# Patient Record
Sex: Female | Born: 1965 | Race: White | Hispanic: No | Marital: Married | Smoking: Former smoker
Health system: Southern US, Community
[De-identification: ages and names within clinical notes are randomized; demographics above are authoritative.]

## PROBLEM LIST (undated history)

## (undated) DIAGNOSIS — N200 Calculus of kidney: Secondary | ICD-10-CM

## (undated) DIAGNOSIS — B009 Herpesviral infection, unspecified: Secondary | ICD-10-CM

## (undated) DIAGNOSIS — R0602 Shortness of breath: Secondary | ICD-10-CM

## (undated) DIAGNOSIS — I10 Essential (primary) hypertension: Secondary | ICD-10-CM

## (undated) DIAGNOSIS — E785 Hyperlipidemia, unspecified: Secondary | ICD-10-CM

## (undated) DIAGNOSIS — R42 Dizziness and giddiness: Secondary | ICD-10-CM

## (undated) HISTORY — DX: Hyperlipidemia, unspecified: E78.5

## (undated) HISTORY — DX: Shortness of breath: R06.02

## (undated) HISTORY — DX: Herpesviral infection, unspecified: B00.9

## (undated) HISTORY — PX: TONSILLECTOMY: SUR1361

## (undated) HISTORY — DX: Dizziness and giddiness: R42

## (undated) HISTORY — DX: Essential (primary) hypertension: I10

---

## 2001-04-04 ENCOUNTER — Ambulatory Visit (HOSPITAL_BASED_OUTPATIENT_CLINIC_OR_DEPARTMENT_OTHER): Admission: RE | Admit: 2001-04-04 | Discharge: 2001-04-04 | Payer: Self-pay | Admitting: *Deleted

## 2004-12-13 ENCOUNTER — Other Ambulatory Visit: Admission: RE | Admit: 2004-12-13 | Discharge: 2004-12-13 | Payer: Self-pay | Admitting: Family Medicine

## 2005-09-08 ENCOUNTER — Emergency Department (HOSPITAL_COMMUNITY): Admission: EM | Admit: 2005-09-08 | Discharge: 2005-09-08 | Payer: Self-pay | Admitting: Family Medicine

## 2005-10-23 ENCOUNTER — Ambulatory Visit (HOSPITAL_COMMUNITY): Admission: RE | Admit: 2005-10-23 | Discharge: 2005-10-23 | Payer: Self-pay | Admitting: Family Medicine

## 2006-03-07 ENCOUNTER — Other Ambulatory Visit: Admission: RE | Admit: 2006-03-07 | Discharge: 2006-03-07 | Payer: Self-pay | Admitting: Family Medicine

## 2006-11-11 ENCOUNTER — Ambulatory Visit (HOSPITAL_COMMUNITY): Admission: RE | Admit: 2006-11-11 | Discharge: 2006-11-11 | Payer: Self-pay | Admitting: Family Medicine

## 2007-05-14 ENCOUNTER — Other Ambulatory Visit: Admission: RE | Admit: 2007-05-14 | Discharge: 2007-05-14 | Payer: Self-pay | Admitting: Family Medicine

## 2007-09-16 ENCOUNTER — Ambulatory Visit (HOSPITAL_COMMUNITY): Admission: RE | Admit: 2007-09-16 | Discharge: 2007-09-16 | Payer: Self-pay | Admitting: Gynecology

## 2008-03-15 ENCOUNTER — Ambulatory Visit (HOSPITAL_COMMUNITY): Admission: RE | Admit: 2008-03-15 | Discharge: 2008-03-15 | Payer: Self-pay | Admitting: Family Medicine

## 2009-03-21 ENCOUNTER — Ambulatory Visit (HOSPITAL_COMMUNITY): Admission: RE | Admit: 2009-03-21 | Discharge: 2009-03-21 | Payer: Self-pay | Admitting: Family Medicine

## 2009-09-16 ENCOUNTER — Other Ambulatory Visit: Admission: RE | Admit: 2009-09-16 | Discharge: 2009-09-16 | Payer: Self-pay | Admitting: Family Medicine

## 2010-04-14 ENCOUNTER — Ambulatory Visit (HOSPITAL_COMMUNITY): Admission: RE | Admit: 2010-04-14 | Discharge: 2010-04-14 | Payer: Self-pay | Admitting: Family Medicine

## 2010-04-19 ENCOUNTER — Encounter: Admission: RE | Admit: 2010-04-19 | Discharge: 2010-04-19 | Payer: Self-pay | Admitting: Family Medicine

## 2010-10-11 ENCOUNTER — Other Ambulatory Visit: Admission: RE | Admit: 2010-10-11 | Discharge: 2010-10-11 | Payer: Self-pay | Admitting: Family Medicine

## 2010-12-16 ENCOUNTER — Emergency Department (HOSPITAL_COMMUNITY)
Admission: EM | Admit: 2010-12-16 | Discharge: 2010-12-16 | Payer: Self-pay | Source: Home / Self Care | Admitting: Emergency Medicine

## 2011-01-21 ENCOUNTER — Encounter: Payer: Self-pay | Admitting: Family Medicine

## 2011-05-18 NOTE — Op Note (Signed)
[place]. [facility]  Patient:    [name], [name]                      MRN: [id number] Proc. Date: 04/04/01 Attending:  Vikki Ports, M.D.                           Operative Report  PREOPERATIVE DIAGNOSIS:  Dysplastic nevus of the right foot.  POSTOPERATIVE DIAGNOSIS:  Dysplastic nevus of the right foot.  PROCEDURE:  Excision of dysplastic nevus of the right foot.  ANESTHESIA:  Local MAC.  DESCRIPTION OF PROCEDURE:  Patient was taken to the operating room and placed in a supine position.  After adequate anesthesia was induced using MAC technique, the right foot was prepped and draped in normal sterile fashion. Using 1% lidocaine local anesthesia, the skin and subcutaneous tissue underlying the nevus on the right lateral foot were anesthetized.  A 2 cm long elliptical incision was made around the previous biopsy site, dissected down onto subcutaneous tissue fat.  The tissue was removed en bloc, and margins were marked.  The tissue was under some tension, and therefore it was closed with interrupted 2-0 nylon mattress sutures.  A sterile dressing was applied. The patient tolerated the procedure well and went to PACU in good condition. DD:  04/04/01 TD:  04/04/01 Job: [id number]

## 2011-10-22 ENCOUNTER — Other Ambulatory Visit (HOSPITAL_COMMUNITY): Payer: Self-pay | Admitting: Family Medicine

## 2011-10-22 DIAGNOSIS — Z1231 Encounter for screening mammogram for malignant neoplasm of breast: Secondary | ICD-10-CM

## 2011-11-14 ENCOUNTER — Ambulatory Visit (HOSPITAL_COMMUNITY)
Admission: RE | Admit: 2011-11-14 | Discharge: 2011-11-14 | Disposition: A | Discharge status: Home / Self Care | Payer: 59 | Source: Ambulatory Visit | Attending: Family Medicine | Admitting: Family Medicine

## 2011-11-14 DIAGNOSIS — Z1231 Encounter for screening mammogram for malignant neoplasm of breast: Secondary | ICD-10-CM | POA: Insufficient documentation

## 2012-07-01 ENCOUNTER — Other Ambulatory Visit (HOSPITAL_COMMUNITY)
Admission: RE | Admit: 2012-07-01 | Discharge: 2012-07-01 | Disposition: A | Discharge status: Home / Self Care | Payer: 59 | Source: Ambulatory Visit | Attending: Family Medicine | Admitting: Family Medicine

## 2012-07-01 ENCOUNTER — Other Ambulatory Visit: Payer: Self-pay | Admitting: Family Medicine

## 2012-07-01 DIAGNOSIS — Z124 Encounter for screening for malignant neoplasm of cervix: Secondary | ICD-10-CM | POA: Insufficient documentation

## 2012-07-23 ENCOUNTER — Emergency Department (HOSPITAL_BASED_OUTPATIENT_CLINIC_OR_DEPARTMENT_OTHER)
Admission: EM | Admit: 2012-07-23 | Discharge: 2012-07-23 | Disposition: A | Discharge status: Home / Self Care | Payer: 59 | Attending: Emergency Medicine | Admitting: Emergency Medicine

## 2012-07-23 ENCOUNTER — Emergency Department (HOSPITAL_BASED_OUTPATIENT_CLINIC_OR_DEPARTMENT_OTHER): Payer: 59

## 2012-07-23 ENCOUNTER — Encounter (HOSPITAL_BASED_OUTPATIENT_CLINIC_OR_DEPARTMENT_OTHER): Payer: Self-pay | Admitting: *Deleted

## 2012-07-23 DIAGNOSIS — N201 Calculus of ureter: Secondary | ICD-10-CM | POA: Insufficient documentation

## 2012-07-23 DIAGNOSIS — N132 Hydronephrosis with renal and ureteral calculous obstruction: Secondary | ICD-10-CM

## 2012-07-23 HISTORY — DX: Essential (primary) hypertension: I10

## 2012-07-23 HISTORY — DX: Calculus of kidney: N20.0

## 2012-07-23 LAB — URINE MICROSCOPIC-ADD ON

## 2012-07-23 LAB — URINALYSIS, ROUTINE W REFLEX MICROSCOPIC
Glucose, UA: NEGATIVE mg/dL
Ketones, ur: 15 mg/dL — AB
Protein, ur: 30 mg/dL — AB
Specific Gravity, Urine: 1.029 (ref 1.005–1.030)
Urobilinogen, UA: 0.2 mg/dL (ref 0.0–1.0)
pH: 5.5 (ref 5.0–8.0)

## 2012-07-23 LAB — BASIC METABOLIC PANEL
BUN: 13 mg/dL (ref 6–23)
CO2: 25 mEq/L (ref 19–32)
Calcium: 8.6 mg/dL (ref 8.4–10.5)
Chloride: 105 mEq/L (ref 96–112)
Creatinine, Ser: 0.9 mg/dL (ref 0.50–1.10)
GFR calc Af Amer: 87 mL/min — ABNORMAL LOW (ref >90)
Glucose, Bld: 101 mg/dL — ABNORMAL HIGH (ref 70–99)
Potassium: 3.7 mEq/L (ref 3.5–5.1)
Sodium: 140 mEq/L (ref 135–145)

## 2012-07-23 MED ORDER — HYDROMORPHONE HCL PF 1 MG/ML IJ SOLN
INTRAMUSCULAR | Status: AC
  Administered 2012-07-23: 1 mg
  Filled 2012-07-23: qty 1

## 2012-07-23 MED ORDER — ONDANSETRON HCL 4 MG/2ML IJ SOLN
INTRAMUSCULAR | Status: AC
  Filled 2012-07-23: qty 2

## 2012-07-23 MED ORDER — ONDANSETRON HCL 4 MG/2ML IJ SOLN
4.0000 mg | Freq: Once | INTRAMUSCULAR | Status: AC
  Administered 2012-07-23: 4 mg via INTRAVENOUS

## 2012-07-23 MED ORDER — TAMSULOSIN HCL 0.4 MG PO CAPS: 0.4000 mg | ORAL_CAPSULE | Freq: Every day | ORAL | Status: DC

## 2012-07-23 MED ORDER — HYDROMORPHONE HCL PF 1 MG/ML IJ SOLN
1.0000 mg | Freq: Once | INTRAMUSCULAR | Status: AC
  Administered 2012-07-23: 1 mg via INTRAVENOUS
  Filled 2012-07-23: qty 1

## 2012-07-23 MED ORDER — SODIUM CHLORIDE 0.9 % IV BOLUS (SEPSIS)
1000.0000 mL | Freq: Once | INTRAVENOUS | Status: AC
  Administered 2012-07-23: 1000 mL via INTRAVENOUS

## 2012-07-23 MED ORDER — KETOROLAC TROMETHAMINE 30 MG/ML IJ SOLN
30.0000 mg | Freq: Once | INTRAMUSCULAR | Status: AC
  Administered 2012-07-23: 30 mg via INTRAVENOUS
  Filled 2012-07-23: qty 1

## 2012-07-23 MED ORDER — OXYCODONE-ACETAMINOPHEN 5-325 MG PO TABS: 1.0000 | ORAL_TABLET | ORAL | Status: AC | PRN

## 2012-07-23 NOTE — ED Notes (Signed)
Pt c/o right flank pain x 2 days  Hx kidney stones

## 2012-07-23 NOTE — ED Provider Notes (Signed)
History     CSN: [id number]  Arrival date & time 07/23/12  1449   First MD Initiated Contact with Patient 07/23/12 1457      Chief Complaint  Patient presents with  . Flank Pain    (Consider location/radiation/quality/duration/timing/severity/associated sxs/prior treatment) HPI Comments: History of kidney stones  Patient is a 46 y.o. female presenting with flank pain. The history is provided by the patient. No language interpreter was used.  Flank Pain This is a new problem. The current episode started yesterday. The problem occurs constantly. The problem has been unchanged. Associated symptoms include abdominal pain and nausea. Pertinent negatives include no fever. She has tried nothing for the symptoms.    Past Medical History  Diagnosis Date  . Kidney calculi   . Hypertension     History reviewed. No pertinent past surgical history.  History reviewed. No pertinent family history.  History  Substance Use Topics  . Smoking status: Never Smoker   . Smokeless tobacco: Not on file  . Alcohol Use: No    OB History    Grav Para Term Preterm Abortions TAB SAB Ect Mult Living                  Review of Systems  Constitutional: Negative.  Negative for fever.  Respiratory: Negative.   Cardiovascular: Negative.   Gastrointestinal: Positive for nausea and abdominal pain.  Genitourinary: Positive for flank pain.    Allergies  Review of patient's allergies indicates no known allergies.  Home Medications  No current outpatient prescriptions on file.  BP 146/79  Pulse 64  Temp 98.3 F (36.8 C) (Oral)  Resp 18  Ht 5\' 5"  (1.651 m)  Wt 200 lb (90.719 kg)  BMI 33.28 kg/m2  SpO2 100%  Physical Exam  Nursing note and vitals reviewed. Constitutional: She is oriented to person, place, and time. She appears well-developed and well-nourished.  HENT:  Head: Normocephalic and atraumatic.  Eyes: Conjunctivae and EOM are normal.  Neck: Neck supple.  Cardiovascular:  Normal rate and regular rhythm.   Pulmonary/Chest: Effort normal and breath sounds normal.  Abdominal: Soft. Bowel sounds are normal. There is no tenderness.  Musculoskeletal: Normal range of motion.  Neurological: She is alert and oriented to person, place, and time.  Skin: Skin is warm and dry.    ED Course  Procedures (including critical care time)  Labs Reviewed  URINALYSIS, ROUTINE W REFLEX MICROSCOPIC - Abnormal; Notable for the following:    Color, Urine BROWN (*)  BIOCHEMICALS MAY BE AFFECTED BY COLOR   APPearance TURBID (*)     Hgb urine dipstick LARGE (*)     Bilirubin Urine SMALL (*)     Ketones, ur 15 (*)     Protein, ur 30 (*)     Leukocytes, UA SMALL (*)     All other components within normal limits  BASIC METABOLIC PANEL - Abnormal; Notable for the following:    Glucose, Bld 101 (*)     GFR calc non Af Amer 75 (*)     GFR calc Af Amer 87 (*)     All other components within normal limits  URINE MICROSCOPIC-ADD ON - Abnormal; Notable for the following:    Bacteria, UA FEW (*)     All other components within normal limits  PREGNANCY, URINE   Ct Abdomen Pelvis Wo Contrast  07/23/2012  *RADIOLOGY REPORT*  Clinical Data: Right flank pain.  Nausea.  Hematuria.  CT ABDOMEN AND PELVIS WITHOUT CONTRAST  Technique:  Multidetector CT imaging of the abdomen and pelvis was performed following the standard protocol without intravenous contrast.  Comparison: None.  Findings: 4.5 mm distal right ureteral obstructing stone with mild right hydronephrosis.  This stone is located 7.2 cm proximal to the renal ureteral vesicle junction.  Evaluation of solid abdominal viscera is limited by lack of IV contrast.  Taking this limitation into account no focal hepatic, splenic, pancreatic, renal or adrenal lesion.  No calcified gallstones.  No extraluminal bowel inflammatory process, free fluid or free air.  No abdominal aortic aneurysm.  No adenopathy.  No bony destructive lesion.  Adnexal  structures and uterus unremarkable.  Contracted urinary bladder.  IMPRESSION: 4.5 mm distal right ureteral obstructing stone with mild right hydronephrosis.  This stone is located 7.2 cm proximal to the renal ureteral vesicle junction.  Original Report Authenticated By: Fuller Canada, M.D.     1. Ureteral stone with hydronephrosis       MDM  Pt is comfortable at this time:pt can follow up with urology:given script for flomax and percocet        Teressa Lower, NP 07/23/12 1640

## 2012-07-24 NOTE — ED Provider Notes (Signed)
History/physical exam/procedure(s) were performed by non-physician practitioner and as supervising physician I was immediately available for consultation/collaboration. I have reviewed all notes and am in agreement with care and plan.   Eleah Lahaie S Dontae Minerva, MD 07/24/12 0715 

## 2013-01-21 ENCOUNTER — Other Ambulatory Visit (HOSPITAL_COMMUNITY): Payer: Self-pay | Admitting: Family Medicine

## 2013-01-21 DIAGNOSIS — Z1231 Encounter for screening mammogram for malignant neoplasm of breast: Secondary | ICD-10-CM

## 2013-02-05 ENCOUNTER — Ambulatory Visit (HOSPITAL_COMMUNITY)
Admission: RE | Admit: 2013-02-05 | Discharge: 2013-02-05 | Disposition: A | Discharge status: Home / Self Care | Payer: 59 | Source: Ambulatory Visit | Attending: Family Medicine | Admitting: Family Medicine

## 2013-02-05 DIAGNOSIS — Z1231 Encounter for screening mammogram for malignant neoplasm of breast: Secondary | ICD-10-CM | POA: Insufficient documentation

## 2013-10-24 ENCOUNTER — Emergency Department (HOSPITAL_COMMUNITY): Payer: PRIVATE HEALTH INSURANCE

## 2013-10-24 ENCOUNTER — Encounter (HOSPITAL_COMMUNITY): Payer: Self-pay | Admitting: Emergency Medicine

## 2013-10-24 ENCOUNTER — Emergency Department (HOSPITAL_COMMUNITY)
Admission: EM | Admit: 2013-10-24 | Discharge: 2013-10-24 | Disposition: A | Discharge status: Home / Self Care | Payer: PRIVATE HEALTH INSURANCE | Attending: Emergency Medicine | Admitting: Emergency Medicine

## 2013-10-24 DIAGNOSIS — Z79899 Other long term (current) drug therapy: Secondary | ICD-10-CM | POA: Insufficient documentation

## 2013-10-24 DIAGNOSIS — Y9301 Activity, walking, marching and hiking: Secondary | ICD-10-CM | POA: Insufficient documentation

## 2013-10-24 DIAGNOSIS — I10 Essential (primary) hypertension: Secondary | ICD-10-CM | POA: Diagnosis not present

## 2013-10-24 DIAGNOSIS — S93609A Unspecified sprain of unspecified foot, initial encounter: Secondary | ICD-10-CM | POA: Diagnosis not present

## 2013-10-24 DIAGNOSIS — S93601A Unspecified sprain of right foot, initial encounter: Secondary | ICD-10-CM

## 2013-10-24 DIAGNOSIS — X500XXA Overexertion from strenuous movement or load, initial encounter: Secondary | ICD-10-CM | POA: Insufficient documentation

## 2013-10-24 DIAGNOSIS — Y929 Unspecified place or not applicable: Secondary | ICD-10-CM | POA: Diagnosis not present

## 2013-10-24 DIAGNOSIS — S8990XA Unspecified injury of unspecified lower leg, initial encounter: Secondary | ICD-10-CM | POA: Diagnosis present

## 2013-10-24 DIAGNOSIS — Z87442 Personal history of urinary calculi: Secondary | ICD-10-CM | POA: Diagnosis not present

## 2013-10-24 MED ORDER — IBUPROFEN 600 MG PO TABS: 600.0000 mg | ORAL_TABLET | Freq: Four times a day (QID) | ORAL | Status: DC | PRN

## 2013-10-24 NOTE — ED Notes (Signed)
Pt c/o pain right metatarsal are after injuring while walking into work today.  Pain increasing as the night went on despite ice, elevation.  Is experiencing increased pain with ambulation.  Swelling noted dorsally.

## 2013-10-24 NOTE — ED Provider Notes (Signed)
CSN: [id number]     Arrival date & time 10/24/13  0230 History   First MD Initiated Contact with Patient 10/24/13 0231     Chief Complaint  Patient presents with  . Fall  . Ankle Pain   (Consider location/radiation/quality/duration/timing/severity/associated sxs/prior Treatment) HPI History per patient. Walking tonight and twisted her right foot stepping off of a curb. Shortly afterwards experienced pain and swelling across the dorsum of right foot. No ankle pain. No LOC. No neck injury. Symptoms moderate severity.  Now is having pain with weightbearing. No previous history of foot injury. Past Medical History  Diagnosis Date  . Kidney calculi   . Hypertension    History reviewed. No pertinent past surgical history. No family history on file. History  Substance Use Topics  . Smoking status: Never Smoker   . Smokeless tobacco: Not on file  . Alcohol Use: No   OB History   Grav Para Term Preterm Abortions TAB SAB Ect Mult Living                 Review of Systems  Skin: Negative for wound.  Neurological: Negative for weakness and numbness.  All other systems reviewed and are negative.    Allergies  Review of patient's allergies indicates no known allergies.  Home Medications   Current Outpatient Rx  Name  Route  Sig  Dispense  Refill  . buPROPion (WELLBUTRIN XL) 300 MG 24 hr tablet   Oral   Take 300 mg by mouth daily.         Marland Kitchen estrogen, conjugated,-medroxyprogesterone (PREMPRO) 0.625-2.5 MG per tablet   Oral   Take 1 tablet by mouth daily.         . fexofenadine (ALLEGRA) 180 MG tablet   Oral   Take 180 mg by mouth daily.         . fish oil-omega-3 fatty acids 1000 MG capsule   Oral   Take 2 g by mouth daily.         . Melatonin-Pyridoxine (MELATIN PO)   Oral   Take 1 tablet by mouth at bedtime as needed. For sleep         . metoprolol tartrate (LOPRESSOR) 25 MG tablet   Oral   Take 25 mg by mouth 2 (two) times daily.         .  valACYclovir (VALTREX) 500 MG tablet   Oral   Take 500 mg by mouth 2 (two) times daily as needed.         Marland Kitchen ibuprofen (ADVIL,MOTRIN) 600 MG tablet   Oral   Take 1 tablet (600 mg total) by mouth every 6 (six) hours as needed for pain.   30 tablet   0    BP 147/96  Pulse 98  Temp(Src) 97.9 F (36.6 C) (Oral)  Resp 20  SpO2 98% Physical Exam  Constitutional: She is oriented to person, place, and time. She appears well-developed and well-nourished.  HENT:  Head: Normocephalic and atraumatic.  Eyes: EOM are normal. Pupils are equal, round, and reactive to light.  Neck: Neck supple.  Cardiovascular: Regular rhythm and intact distal pulses.   Pulmonary/Chest: Effort normal. No respiratory distress.  Musculoskeletal:  Tenderness over the dorsum of right foot with edema. No ecchymosis. No obvious deformity. No tenderness over the ankle or proximal fibula. Distal cap refill and pulses, motor and sensorium intact  Neurological: She is alert and oriented to person, place, and time.  Skin: Skin is warm and dry.  ED Course  Procedures (including critical care time) Labs Review Labs Reviewed - No data to display Imaging Review Dg Foot Complete Right  10/24/2013   CLINICAL DATA:  Status post fall; right superior foot pain.  EXAM: RIGHT FOOT COMPLETE - 3+ VIEW  COMPARISON:  None.  FINDINGS: There is no evidence of fracture or dislocation. The joint spaces are preserved. There is no evidence of talar subluxation; the subtalar joint is unremarkable in appearance. A small plantar calcaneal spur is incidentally seen.  No significant soft tissue abnormalities are seen.  IMPRESSION: No evidence of fracture or dislocation.   Electronically Signed   By: Roanna Raider M.D.   On: 10/24/2013 03:16    Advil prior to arrival Ice Imaging reviewed as above Placed in a postop shoe, crutches declined  Plan discharge with outpatient referral as needed. Occult fracture precautions provided. NSAIDs,  ice, elevation.  MDM   1. Foot sprain, right, initial encounter    X-ray reviewed as above - no apparent fracture Vital signs and nursing notes reviewed and considered    Sunnie Nielsen, MD 10/24/13 [phone]

## 2013-10-24 NOTE — ED Notes (Signed)
Patient fell coming into work yesterday afternoon.  Patient with swelling noted on top of right foot.  Patient now with limping and having a hard time walking. No LOC.  Full recall.

## 2013-10-24 NOTE — ED Notes (Signed)
Patient transported to X-ray 

## 2013-10-24 NOTE — ED Notes (Signed)
Returned from [employer].  No change in status.

## 2014-07-21 ENCOUNTER — Other Ambulatory Visit (HOSPITAL_COMMUNITY)
Admission: RE | Admit: 2014-07-21 | Discharge: 2014-07-21 | Disposition: A | Discharge status: Home / Self Care | Payer: 59 | Source: Ambulatory Visit | Attending: Obstetrics & Gynecology | Admitting: Obstetrics & Gynecology

## 2014-07-21 ENCOUNTER — Other Ambulatory Visit: Payer: Self-pay | Admitting: Obstetrics & Gynecology

## 2014-07-21 DIAGNOSIS — Z1151 Encounter for screening for human papillomavirus (HPV): Secondary | ICD-10-CM | POA: Insufficient documentation

## 2014-07-21 DIAGNOSIS — Z01419 Encounter for gynecological examination (general) (routine) without abnormal findings: Secondary | ICD-10-CM | POA: Insufficient documentation

## 2014-07-22 LAB — CYTOLOGY - PAP

## 2014-10-25 ENCOUNTER — Emergency Department (HOSPITAL_BASED_OUTPATIENT_CLINIC_OR_DEPARTMENT_OTHER): Payer: 59

## 2014-10-25 ENCOUNTER — Emergency Department (HOSPITAL_BASED_OUTPATIENT_CLINIC_OR_DEPARTMENT_OTHER)
Admission: EM | Admit: 2014-10-25 | Discharge: 2014-10-25 | Disposition: A | Discharge status: Home / Self Care | Payer: 59 | Attending: Emergency Medicine | Admitting: Emergency Medicine

## 2014-10-25 ENCOUNTER — Encounter (HOSPITAL_BASED_OUTPATIENT_CLINIC_OR_DEPARTMENT_OTHER): Payer: Self-pay | Admitting: Emergency Medicine

## 2014-10-25 DIAGNOSIS — Z79899 Other long term (current) drug therapy: Secondary | ICD-10-CM | POA: Diagnosis not present

## 2014-10-25 DIAGNOSIS — Z791 Long term (current) use of non-steroidal anti-inflammatories (NSAID): Secondary | ICD-10-CM | POA: Diagnosis not present

## 2014-10-25 DIAGNOSIS — I10 Essential (primary) hypertension: Secondary | ICD-10-CM | POA: Insufficient documentation

## 2014-10-25 DIAGNOSIS — N200 Calculus of kidney: Secondary | ICD-10-CM | POA: Insufficient documentation

## 2014-10-25 DIAGNOSIS — R109 Unspecified abdominal pain: Secondary | ICD-10-CM | POA: Diagnosis present

## 2014-10-25 LAB — URINE MICROSCOPIC-ADD ON

## 2014-10-25 LAB — CBC WITH DIFFERENTIAL/PLATELET
Basophils Absolute: 0.1 K/uL (ref 0.0–0.1)
Basophils Relative: 1 % (ref 0–1)
Eosinophils Absolute: 0.1 K/uL (ref 0.0–0.7)
Eosinophils Relative: 1 % (ref 0–5)
HCT: 40.2 % (ref 36.0–46.0)
Hemoglobin: 13.3 g/dL (ref 12.0–15.0)
Lymphocytes Relative: 19 % (ref 12–46)
Lymphs Abs: 2 K/uL (ref 0.7–4.0)
MCH: 31.3 pg (ref 26.0–34.0)
MCHC: 33.1 g/dL (ref 30.0–36.0)
MCV: 94.6 fL (ref 78.0–100.0)
Monocytes Absolute: 0.9 K/uL (ref 0.1–1.0)
Monocytes Relative: 8 % (ref 3–12)
Neutro Abs: 7.7 K/uL (ref 1.7–7.7)
Neutrophils Relative %: 71 % (ref 43–77)
Platelets: 210 K/uL (ref 150–400)
RBC: 4.25 MIL/uL (ref 3.87–5.11)
RDW: 13.2 % (ref 11.5–15.5)
WBC: 10.7 K/uL — ABNORMAL HIGH (ref 4.0–10.5)

## 2014-10-25 LAB — BASIC METABOLIC PANEL WITH GFR
Anion gap: 12 (ref 5–15)
BUN: 15 mg/dL (ref 6–23)
CO2: 28 meq/L (ref 19–32)
Calcium: 9.2 mg/dL (ref 8.4–10.5)
Chloride: 102 meq/L (ref 96–112)
Creatinine, Ser: 0.9 mg/dL (ref 0.50–1.10)
GFR calc Af Amer: 86 mL/min — ABNORMAL LOW (ref >90)
GFR calc non Af Amer: 74 mL/min — ABNORMAL LOW (ref >90)
Glucose, Bld: 96 mg/dL (ref 70–99)
Potassium: 4 meq/L (ref 3.7–5.3)
Sodium: 142 meq/L (ref 137–147)

## 2014-10-25 LAB — URINALYSIS, ROUTINE W REFLEX MICROSCOPIC
BILIRUBIN URINE: NEGATIVE
GLUCOSE, UA: NEGATIVE mg/dL
Ketones, ur: NEGATIVE mg/dL
Leukocytes, UA: NEGATIVE
Nitrite: NEGATIVE
PH: 5 (ref 5.0–8.0)
Protein, ur: NEGATIVE mg/dL
SPECIFIC GRAVITY, URINE: 1.03 (ref 1.005–1.030)
UROBILINOGEN UA: 0.2 mg/dL (ref 0.0–1.0)

## 2014-10-25 MED ORDER — ONDANSETRON 8 MG PO TBDP
8.0000 mg | ORAL_TABLET | Freq: Once | ORAL | Status: AC
  Administered 2014-10-25: 8 mg via ORAL
  Filled 2014-10-25: qty 1

## 2014-10-25 MED ORDER — TAMSULOSIN HCL 0.4 MG PO CAPS: 0.4000 mg | ORAL_CAPSULE | Freq: Every day | ORAL | Status: DC

## 2014-10-25 MED ORDER — NITROFURANTOIN MONOHYD MACRO 100 MG PO CAPS: 100.0000 mg | ORAL_CAPSULE | Freq: Two times a day (BID) | ORAL | Status: DC

## 2014-10-25 MED ORDER — ONDANSETRON 8 MG PO TBDP: ORAL_TABLET | ORAL | Status: DC

## 2014-10-25 MED ORDER — KETOROLAC TROMETHAMINE 30 MG/ML IJ SOLN
30.0000 mg | Freq: Once | INTRAMUSCULAR | Status: AC
  Administered 2014-10-25: 30 mg via INTRAVENOUS
  Filled 2014-10-25: qty 1

## 2014-10-25 MED ORDER — IBUPROFEN 800 MG PO TABS: 800.0000 mg | ORAL_TABLET | Freq: Three times a day (TID) | ORAL | Status: DC

## 2014-10-25 MED ORDER — NITROFURANTOIN MONOHYD MACRO 100 MG PO CAPS
100.0000 mg | ORAL_CAPSULE | Freq: Two times a day (BID) | ORAL | Status: DC
  Administered 2014-10-25: 100 mg via ORAL
  Filled 2014-10-25: qty 1

## 2014-10-25 MED ORDER — OXYCODONE-ACETAMINOPHEN 5-325 MG PO TABS: 1.0000 | ORAL_TABLET | Freq: Four times a day (QID) | ORAL | Status: DC | PRN

## 2014-10-25 NOTE — ED Notes (Signed)
Returned from CT.

## 2014-10-25 NOTE — Discharge Instructions (Signed)

## 2014-10-25 NOTE — ED Notes (Signed)
Pt reports pressure in her bladder and difficulty urinating.  Pt reports has hx of kidney stones and unsure if that is what is causing the pain. Pt also had one episode of vomiting prior to arrival.

## 2014-10-25 NOTE — ED Notes (Signed)
Transported to CT 

## 2014-10-25 NOTE — ED Notes (Signed)
Pt reports took an old flomax yesterday to try and help with symptoms.

## 2014-10-25 NOTE — ED Provider Notes (Signed)
CSN: [id number]     Arrival date & time 10/25/14  0400 History   First MD Initiated Contact with Patient 10/25/14 0430     Chief Complaint  Patient presents with  . Urinary Tract Infection     (Consider location/radiation/quality/duration/timing/severity/associated sxs/prior Treatment) Patient is a 48 y.o. female presenting with flank pain. The history is provided by the patient. No language interpreter was used.  Flank Pain This is a recurrent problem. The current episode started more than 2 days ago. Episode frequency: intermittently. The problem has been gradually improving. Pertinent negatives include no chest pain, no headaches and no shortness of breath. Nothing aggravates the symptoms. Nothing relieves the symptoms. Treatments tried: flomax. The treatment provided no relief.  Has had frequency and hesitancy and no pressure in the bladder and has noted some blood in the urine.  States she thought at first it was a stone and now it feels like an infection.    Past Medical History  Diagnosis Date  . Kidney calculi   . Hypertension    History reviewed. No pertinent past surgical history. No family history on file. History  Substance Use Topics  . Smoking status: Never Smoker   . Smokeless tobacco: Not on file  . Alcohol Use: Yes   OB History   Grav Para Term Preterm Abortions TAB SAB Ect Mult Living                 Review of Systems  Respiratory: Negative for shortness of breath.   Cardiovascular: Negative for chest pain.  Gastrointestinal: Positive for vomiting.  Genitourinary: Positive for frequency, hematuria, flank pain and difficulty urinating.  Neurological: Negative for headaches.  All other systems reviewed and are negative.     Allergies  Review of patient's allergies indicates no known allergies.  Home Medications   Prior to Admission medications   Medication Sig Start Date End Date Taking? Authorizing Provider  buPROPion (WELLBUTRIN XL) 300 MG 24 hr  tablet Take 300 mg by mouth daily.    Historical Provider, MD  estrogen, conjugated,-medroxyprogesterone (PREMPRO) 0.625-2.5 MG per tablet Take 1 tablet by mouth daily.    Historical Provider, MD  fexofenadine (ALLEGRA) 180 MG tablet Take 180 mg by mouth daily.    Historical Provider, MD  fish oil-omega-3 fatty acids 1000 MG capsule Take 2 g by mouth daily.    Historical Provider, MD  ibuprofen (ADVIL,MOTRIN) 600 MG tablet Take 1 tablet (600 mg total) by mouth every 6 (six) hours as needed for pain. 10/24/13   Teressa Lower, MD  Melatonin-Pyridoxine (MELATIN PO) Take 1 tablet by mouth at bedtime as needed. For sleep    Historical Provider, MD  metoprolol tartrate (LOPRESSOR) 25 MG tablet Take 25 mg by mouth 2 (two) times daily.    Historical Provider, MD  valACYclovir (VALTREX) 500 MG tablet Take 500 mg by mouth 2 (two) times daily as needed.    Historical Provider, MD   BP 135/88  Pulse 73  Temp(Src) 97.7 F (36.5 C) (Oral)  Resp 20  Ht 5\' 5"  (1.651 m)  Wt 210 lb (95.255 kg)  BMI 34.95 kg/m2  SpO2 100% Physical Exam  Constitutional: She is oriented to person, place, and time. She appears well-developed and well-nourished. No distress.  HENT:  Head: Normocephalic and atraumatic.  Mouth/Throat: Oropharynx is clear and moist.  Eyes: Conjunctivae are normal. Pupils are equal, round, and reactive to light.  Neck: Normal range of motion. Neck supple.  Cardiovascular: Normal rate, regular rhythm and  intact distal pulses.   Pulmonary/Chest: Effort normal and breath sounds normal. She has no wheezes. She has no rales.  Abdominal: Soft. Bowel sounds are normal. There is no tenderness. There is no rebound and no guarding.  Musculoskeletal: Normal range of motion.  Neurological: She is alert and oriented to person, place, and time.  Skin: Skin is warm and dry.  Psychiatric: She has a normal mood and affect.    ED Course  Procedures (including critical care time) Labs Review Labs Reviewed   URINALYSIS, ROUTINE W REFLEX MICROSCOPIC - Abnormal; Notable for the following:    Color, Urine AMBER (*)    APPearance CLOUDY (*)    Hgb urine dipstick LARGE (*)    All other components within normal limits  URINE MICROSCOPIC-ADD ON - Abnormal; Notable for the following:    Bacteria, UA FEW (*)    Casts HYALINE CASTS (*)    All other components within normal limits  CBC WITH DIFFERENTIAL  BASIC METABOLIC PANEL    Imaging Review No results found.   EKG Interpretation None      MDM   Final diagnoses:  Kidney stone    Likely passed stone,  Will treat with percocet prn, Ibuprofen 800 mg TID, zofran ODT, flomax and will add macrobid for seven days strain all urine and follow up with urology.  Patient verbalizes understanding and agrees to follow up    Kourtlynn Trevor Alfonso Patten, MD 10/25/14 [phone]

## 2015-01-24 ENCOUNTER — Other Ambulatory Visit (HOSPITAL_COMMUNITY): Payer: Self-pay | Admitting: Family Medicine

## 2015-01-24 DIAGNOSIS — Z1231 Encounter for screening mammogram for malignant neoplasm of breast: Secondary | ICD-10-CM

## 2015-02-07 ENCOUNTER — Ambulatory Visit (HOSPITAL_COMMUNITY)
Admission: RE | Admit: 2015-02-07 | Discharge: 2015-02-07 | Disposition: A | Discharge status: Home / Self Care | Payer: 59 | Source: Ambulatory Visit | Attending: Family Medicine | Admitting: Family Medicine

## 2015-02-07 DIAGNOSIS — Z1231 Encounter for screening mammogram for malignant neoplasm of breast: Secondary | ICD-10-CM | POA: Insufficient documentation

## 2015-03-23 ENCOUNTER — Other Ambulatory Visit: Payer: Self-pay | Admitting: Family Medicine

## 2015-03-23 ENCOUNTER — Ambulatory Visit
Admission: RE | Admit: 2015-03-23 | Discharge: 2015-03-23 | Disposition: A | Discharge status: Home / Self Care | Payer: 59 | Source: Ambulatory Visit | Attending: Family Medicine | Admitting: Family Medicine

## 2015-03-23 DIAGNOSIS — R079 Chest pain, unspecified: Secondary | ICD-10-CM

## 2015-08-26 ENCOUNTER — Other Ambulatory Visit: Payer: Self-pay | Admitting: Gastroenterology

## 2015-09-07 ENCOUNTER — Encounter: Payer: Self-pay | Admitting: Cardiology

## 2015-09-07 ENCOUNTER — Encounter: Payer: Self-pay | Admitting: Cardiovascular Disease

## 2015-10-05 ENCOUNTER — Ambulatory Visit: Payer: 59 | Admitting: Cardiovascular Disease

## 2015-10-05 ENCOUNTER — Ambulatory Visit: Payer: 59 | Admitting: Cardiology

## 2015-10-06 ENCOUNTER — Telehealth: Payer: Self-pay | Admitting: Cardiovascular Disease

## 2015-10-06 NOTE — Telephone Encounter (Signed)
Received records from [facility] for appointment on 10/21/15 with Dr Oval Linsey.  Records given to [facility] (medical records) for Dr Blenda Mounts schedule on 10/21/15. lp

## 2015-10-20 NOTE — Progress Notes (Signed)
Cardiology Office Note   Date:  10/21/2015   ID:  [name], DOB 1966-03-14, MRN [id number]  PCP:  Hulen Shouts, MD  Cardiologist:   Sharol Harness, MD   Chief Complaint  Patient presents with  . New Evaluation    pt c/o some dizziness  . Chest Pain    no chest pain  . Shortness of Breath    a little  . Edema    no swelling in legs      History of Present Illness: [name] is a 49 y.o. female with hypertension and hyperlipidemia who presents for an evaluation of dizziness.  She notes occasional lightheadedness when running around at work.  The episodes occur sporadically and last for 4-5 minutes.  They are not associated with positional changes.  She reports episodes of vasovagal syncope in the past when she sees needles or has to give blood. This is distinctly different from those episodes.  There is no associated chest pain or shortness of breath.  She also denies palpitations, nausea, vomiting or diaphoresis.  She is especially concerned because her brother was recently diagnosed with 2 coronary blockages despite being in good health. Her father died young of an accident, but the family suspicious that he may have had coronary disease.  She does not occasional back pain that is not exertional and does not occur at the same time as the lightheadedness.  She saw her PCP, Dr. Maurice Small on 08/08/15 and was referred to cardiology for further evaluation.    [name] has noted shortness of breath with exertion but hasn't been working out for a while.  In the past she worked out with a Clinical research associate and enjoyed it.  Swent hiking last weekend and noticed that she was more short of breath and she would've expected. She is unsure if this is related to the dizziness episodes or if it is because she is over weight and out of shape. She notes  and occasional fluttering sensation in her heart that last for seconds and is not associated with lightheadedness, dizziness,  chest pain or shortness of breath.  [name] denies lower extremity edema, orthopnea, or PND.  [name] reports that her diet is poor.  She works as a Biomedical engineer in the ED.  She works long hours and has a hard time eating healthily.  She also is a picky eater and does not like many healthy foods.    Past Medical History  Diagnosis Date  . Kidney calculi   . Hypertension     No past surgical history on file.   Current Outpatient Prescriptions  Medication Sig Dispense Refill  . aspirin 81 MG tablet Take 81 mg by mouth daily.    Marland Kitchen buPROPion (WELLBUTRIN XL) 300 MG 24 hr tablet Take 300 mg by mouth daily.    Marland Kitchen estrogen, conjugated,-medroxyprogesterone (PREMPRO) 0.625-2.5 MG per tablet Take 1 tablet by mouth daily.    . fexofenadine (ALLEGRA) 180 MG tablet Take 180 mg by mouth daily.    . fish oil-omega-3 fatty acids 1000 MG capsule Take 2 g by mouth daily.    . Melatonin-Pyridoxine (MELATIN PO) Take 1 tablet by mouth at bedtime as needed. For sleep    . metoprolol tartrate (LOPRESSOR) 25 MG tablet Take 25 mg by mouth 2 (two) times daily.    . valACYclovir (VALTREX) 500 MG tablet Take 500 mg by mouth 2 (two) times daily as needed.     No  current facility-administered medications for this visit.    Allergies:   Review of patient's allergies indicates no known allergies.    Social History:  The patient  reports that she has never smoked. She does not have any smokeless tobacco history on file. She reports that she drinks alcohol. She reports that she does not use illicit drugs.   Family History:  The patient's family history is not on file.    ROS:  Please see the history of present illness.   Otherwise, review of systems are positive for none.   All other systems are reviewed and negative.    PHYSICAL EXAM: VS:  BP 124/66 mmHg  Pulse 55  Ht 5\' 5"  (1.651 m)  Wt 103.057 kg (227 lb 3.2 oz)  BMI 37.81 kg/m2 , BMI Body mass index is 37.81 kg/(m^2). GENERAL:  Well  appearing HEENT:  Pupils equal round and reactive, fundi not visualized, oral mucosa unremarkable NECK:  No jugular venous distention, waveform within normal limits, carotid upstroke brisk and symmetric, no bruits, no thyromegaly LYMPHATICS:  No cervical adenopathy LUNGS:  Clear to auscultation bilaterally HEART:  RRR.  PMI not displaced or sustained,S1 and S2 within normal limits, no S3, no S4, no clicks, no rubs, no murmurs ABD:  Flat, positive bowel sounds normal in frequency in pitch, no bruits, no rebound, no guarding, no midline pulsatile mass, no hepatomegaly, no splenomegaly EXT:  2 plus pulses throughout, no edema, no cyanosis no clubbing SKIN:  No rashes no nodules NEURO:  Cranial nerves II through XII grossly intact, motor grossly intact throughout PSYCH:  Cognitively intact, oriented to person place and time   EKG:  EKG is ordered today. The ekg ordered today demonstrates sinus bradycardia at 55 bpm.     Recent Labs: 10/25/2014: BUN 15; Creatinine, Ser 0.90; Hemoglobin 13.3; Platelets 210; Potassium 4.0; Sodium 142    Lipid Panel No results found for: CHOL, TRIG, HDL, CHOLHDL, VLDL, LDLCALC, LDLDIRECT 07/17/15: Chol 223, tri 150, hdl 57, ldl 136   Wt Readings from Last 3 Encounters:  10/21/15 103.057 kg (227 lb 3.2 oz)  10/25/14 95.255 kg (210 lb)  07/23/12 90.719 kg (200 lb)      ASSESSMENT AND PLAN:  # Dizziness: [name] reports episodes of dizziness with exertion as well as shortness of breath with exertion. Her brother has premature coronary artery disease and she is concerned that she mans well. Overall, I think that her risk is low. However she does have several risk factors including well-controlled hypertension and hyperlipidemia. She was also a smoker in the past. We will refer her for treadmill stress testing to evaluate for obstructive coronary disease. He does not, she is having basal episodes or orthostatic hypotension. She is not orthostatic on exam  today. I'm suspicious that she may have some chronotropic incompetence, as her resting heart rate is 55 today. If her stress test is normal, we will consider switching her metoprolol to an alternative agent to see if this alleviates or dizziness.  # Hyperlipidemia/obesity: Ms. Sublette total cholesterol is 223 and LDL 136.  We discussed the fact that these numbers are elevated despite the fact that she has ASCVD 10 year risk of 1.7%. Her overall risk is low because of her age, but if we do not make some changes with her diet and lifestyle, this risk will increase substantially with time. We've recommended that she increase her physical activity at least 30-40 minutes most days of the week. We also recommended that she  start putting out her meals in bringing her lunch to work so that she does not make poor decisions while at work.    Current medicines are reviewed at length with the patient today.  The patient does not have concerns regarding medicines.  The following changes have been made:  no change  Labs/ tests ordered today include:   Orders Placed This Encounter  Procedures  . Exercise Tolerance Test  . EKG 12-Lead     Disposition:   FU with Betzaida Cremeens C. Oval Linsey, MD in 6 months.    Signed, Sharol Harness, MD  10/21/2015 12:26 PM    [place]

## 2015-10-21 ENCOUNTER — Encounter: Payer: Self-pay | Admitting: Cardiovascular Disease

## 2015-10-21 ENCOUNTER — Ambulatory Visit (INDEPENDENT_AMBULATORY_CARE_PROVIDER_SITE_OTHER): Payer: 59 | Admitting: Cardiovascular Disease

## 2015-10-21 VITALS — BP 124/66 | HR 55 | Ht 65.0 in | Wt 227.2 lb

## 2015-10-21 DIAGNOSIS — R001 Bradycardia, unspecified: Secondary | ICD-10-CM

## 2015-10-21 DIAGNOSIS — E785 Hyperlipidemia, unspecified: Secondary | ICD-10-CM | POA: Diagnosis not present

## 2015-10-21 DIAGNOSIS — I10 Essential (primary) hypertension: Secondary | ICD-10-CM

## 2015-10-21 DIAGNOSIS — R0602 Shortness of breath: Secondary | ICD-10-CM | POA: Insufficient documentation

## 2015-10-21 DIAGNOSIS — R42 Dizziness and giddiness: Secondary | ICD-10-CM

## 2015-10-21 HISTORY — DX: Dizziness and giddiness: R42

## 2015-10-21 HISTORY — DX: Essential (primary) hypertension: I10

## 2015-10-21 HISTORY — DX: Hyperlipidemia, unspecified: E78.5

## 2015-10-21 HISTORY — DX: Shortness of breath: R06.02

## 2015-10-21 NOTE — Patient Instructions (Signed)
Your physician has requested that you have an exercise tolerance test. For further information please visit [email or web address]. Please also follow instruction sheet, as given.  Dr Oval Linsey recommends that you schedule a follow-up appointment in 6 months. You will receive a reminder letter in the mail two months in advance. If you don't receive a letter, please call our office to schedule the follow-up appointment.

## 2015-11-29 ENCOUNTER — Inpatient Hospital Stay (HOSPITAL_COMMUNITY): Admission: RE | Admit: 2015-11-29 | Payer: 59 | Source: Ambulatory Visit

## 2015-12-15 ENCOUNTER — Telehealth (HOSPITAL_COMMUNITY): Payer: Self-pay

## 2015-12-15 NOTE — Telephone Encounter (Signed)
Encounter complete. 

## 2015-12-20 ENCOUNTER — Ambulatory Visit (HOSPITAL_COMMUNITY)
Admission: RE | Admit: 2015-12-20 | Discharge: 2015-12-20 | Disposition: A | Discharge status: Home / Self Care | Payer: 59 | Source: Ambulatory Visit | Attending: Cardiology | Admitting: Cardiology

## 2015-12-20 DIAGNOSIS — R001 Bradycardia, unspecified: Secondary | ICD-10-CM | POA: Diagnosis not present

## 2015-12-20 DIAGNOSIS — R0602 Shortness of breath: Secondary | ICD-10-CM | POA: Insufficient documentation

## 2015-12-21 ENCOUNTER — Telehealth: Payer: Self-pay | Admitting: *Deleted

## 2015-12-21 LAB — EXERCISE TOLERANCE TEST
CHL CUP MPHR: 170 {beats}/min
CHL CUP RESTING HR STRESS: 102 {beats}/min
CHL RATE OF PERCEIVED EXERTION: 15
CSEPED: 6 min
CSEPEW: 7 METS
Peak HR: 157 {beats}/min
Percent HR: 92 %

## 2015-12-21 NOTE — Telephone Encounter (Signed)
Left detailed message on secure voicemail Any question may call back

## 2015-12-21 NOTE — Telephone Encounter (Signed)
-----   Message from Skeet Latch, MD sent at 12/21/2015  3:33 PM EST ----- Normal stress test

## 2016-01-13 MED FILL — PREMPRO 0.625-2.5 MG TABLET: 0.625-2.5 | 56 days supply | Qty: 56 | Fill #4

## 2016-03-14 MED FILL — BUPROPION HCL XL 300 MG TAB: 300 | 90 days supply | Qty: 90 | Fill #0

## 2016-03-14 MED FILL — METOPROLOL TARTRATE 25 MG T: 25 | 90 days supply | Qty: 180 | Fill #0

## 2016-03-14 MED FILL — PREMPRO 0.625-2.5 MG TABLET: 0.625-2.5 | 84 days supply | Qty: 84 | Fill #0

## 2016-03-14 MED FILL — VALACYCLOVIR HCL 500 MG TAB: 500 | 90 days supply | Qty: 90 | Fill #0

## 2016-04-16 ENCOUNTER — Other Ambulatory Visit: Payer: Self-pay

## 2016-04-16 DIAGNOSIS — Z1231 Encounter for screening mammogram for malignant neoplasm of breast: Secondary | ICD-10-CM

## 2016-05-03 ENCOUNTER — Ambulatory Visit: Payer: 59

## 2016-05-17 ENCOUNTER — Ambulatory Visit
Admission: RE | Admit: 2016-05-17 | Discharge: 2016-05-17 | Disposition: A | Discharge status: Home / Self Care | Payer: 59 | Source: Ambulatory Visit

## 2016-05-17 DIAGNOSIS — Z1231 Encounter for screening mammogram for malignant neoplasm of breast: Secondary | ICD-10-CM | POA: Diagnosis not present

## 2016-05-21 ENCOUNTER — Other Ambulatory Visit: Payer: Self-pay | Admitting: Family Medicine

## 2016-05-21 DIAGNOSIS — H524 Presbyopia: Secondary | ICD-10-CM | POA: Diagnosis not present

## 2016-05-21 DIAGNOSIS — H52222 Regular astigmatism, left eye: Secondary | ICD-10-CM | POA: Diagnosis not present

## 2016-05-21 DIAGNOSIS — H5201 Hypermetropia, right eye: Secondary | ICD-10-CM | POA: Diagnosis not present

## 2016-05-21 DIAGNOSIS — R928 Other abnormal and inconclusive findings on diagnostic imaging of breast: Secondary | ICD-10-CM

## 2016-05-21 DIAGNOSIS — H5212 Myopia, left eye: Secondary | ICD-10-CM | POA: Diagnosis not present

## 2016-05-29 ENCOUNTER — Ambulatory Visit
Admission: RE | Admit: 2016-05-29 | Discharge: 2016-05-29 | Disposition: A | Discharge status: Home / Self Care | Payer: 59 | Source: Ambulatory Visit | Attending: Family Medicine | Admitting: Family Medicine

## 2016-05-29 DIAGNOSIS — N63 Unspecified lump in breast: Secondary | ICD-10-CM | POA: Diagnosis not present

## 2016-05-29 DIAGNOSIS — R928 Other abnormal and inconclusive findings on diagnostic imaging of breast: Secondary | ICD-10-CM

## 2016-05-29 DIAGNOSIS — N6012 Diffuse cystic mastopathy of left breast: Secondary | ICD-10-CM | POA: Diagnosis not present

## 2016-05-31 ENCOUNTER — Encounter: Payer: Self-pay | Admitting: Cardiovascular Disease

## 2016-05-31 ENCOUNTER — Ambulatory Visit (INDEPENDENT_AMBULATORY_CARE_PROVIDER_SITE_OTHER): Payer: 59 | Admitting: Cardiovascular Disease

## 2016-05-31 DIAGNOSIS — R42 Dizziness and giddiness: Secondary | ICD-10-CM | POA: Diagnosis not present

## 2016-05-31 DIAGNOSIS — E669 Obesity, unspecified: Secondary | ICD-10-CM

## 2016-05-31 DIAGNOSIS — E785 Hyperlipidemia, unspecified: Secondary | ICD-10-CM | POA: Diagnosis not present

## 2016-05-31 NOTE — Patient Instructions (Addendum)
Medication Instructions:  Your physician recommends that you continue on your current medications as directed. Please refer to the Current Medication list given to you today.  Labwork: none  Testing/Procedures: none  Follow-Up: As needed   If you need a refill on your cardiac medications before your next appointment, please call your pharmacy.  

## 2016-05-31 NOTE — Progress Notes (Signed)
Cardiology Office Note   Date:  05/31/2016   ID:  ADI TETRAULT, DOB 05-20-1966, MRN [id number]  PCP:  Jonathon Bellows, MD  Cardiologist:   Skeet Latch, MD   Chief Complaint  Patient presents with  . Follow-up    6 months  pt states no Sx.      History of Present Illness: BIJAN GARCIARAMIREZ is a 50 y.o. female with hypertension and hyperlipidemia who presents for follow up on dizziness.  She was first seen in clinic on 10/21/15. At that time she reported sporadic episodes of lightheadedness while at work. She also noted shortness of breath with exertion.  She was referred for an exercise tolerance test that was negative for ischemia. She exercised for 6 minutes  she had a normal blood pressure and heart rate response to exercise.  Since her last appointment Ms. Asti has been feeling well.  She denies any dizziness. She also has not noted any chest pain or shortness of breath.  She hasn't started exercising yet but hopes to see him. She does note that her diet has improved.he notes that her diet has improved.  She is bringing her food for lunch instead of ordering food. She has loss or pounds since her last she denies any lower extremity edema, orthopnea or PND.      Past Medical History  Diagnosis Date  . Kidney calculi   . Hypertension   . Essential hypertension 10/21/2015  . Hyperlipidemia 10/21/2015  . Dizziness 10/21/2015  . Shortness of breath 10/21/2015    No past surgical history on file.   Current Outpatient Prescriptions  Medication Sig Dispense Refill  . aspirin 81 MG tablet Take 81 mg by mouth daily.    Marland Kitchen buPROPion (WELLBUTRIN XL) 300 MG 24 hr tablet Take 300 mg by mouth daily.    Marland Kitchen estrogen, conjugated,-medroxyprogesterone (PREMPRO) 0.625-2.5 MG per tablet Take 1 tablet by mouth daily.    . fexofenadine (ALLEGRA) 180 MG tablet Take 180 mg by mouth daily.    . fish oil-omega-3 fatty acids 1000 MG capsule Take 2 g by mouth daily.    .  Melatonin-Pyridoxine (MELATIN PO) Take 1 tablet by mouth at bedtime as needed. For sleep    . metoprolol tartrate (LOPRESSOR) 25 MG tablet Take 25 mg by mouth 2 (two) times daily.    . valACYclovir (VALTREX) 500 MG tablet Take 500 mg by mouth 2 (two) times daily as needed.     No current facility-administered medications for this visit.    Allergies:   Review of patient's allergies indicates no known allergies.    Social History:  The patient  reports that she has never smoked. She does not have any smokeless tobacco history on file. She reports that she drinks alcohol. She reports that she does not use illicit drugs.   Family History:  The patient's family history includes Atrial fibrillation in her sister; CAD in her brother.    ROS:  Please see the history of present illness.   Otherwise, review of systems are positive for none.   All other systems are reviewed and negative.    PHYSICAL EXAM: VS:  BP 118/74 mmHg  Pulse 65  Ht 5\' 5"  (1.651 m)  Wt 223 lb 3.2 oz (101.243 kg)  BMI 37.14 kg/m2 , BMI Body mass index is 37.14 kg/(m^2). GENERAL:  Well appearing HEENT:  Pupils equal round and reactive, fundi not visualized, oral mucosa unremarkable NECK:  No jugular venous distention, waveform  within normal limits, carotid upstroke brisk and symmetric, no bruits LYMPHATICS:  No cervical adenopathy LUNGS:  Clear to auscultation bilaterally HEART:  RRR.  PMI not displaced or sustained,S1 and S2 within normal limits, no S3, no S4, no clicks, no rubs, no murmurs ABD:  Flat, positive bowel sounds normal in frequency in pitch, no bruits, no rebound, no guarding, no midline pulsatile mass, no hepatomegaly, no splenomegaly EXT:  2 plus pulses throughout, no edema, no cyanosis no clubbing SKIN:  No rashes no nodules NEURO:  Cranial nerves II through XII grossly intact, motor grossly intact throughout PSYCH:  Cognitively intact, oriented to person place and time  EKG:  EKG is not ordered  today.  Recent Labs: No results found for requested labs within last 365 days.   Lipid Panel No results found for: CHOL, TRIG, HDL, CHOLHDL, VLDL, LDLCALC, LDLDIRECT   07/17/15: Chol 223, tri 150, hdl 57, ldl 136   Wt Readings from Last 3 Encounters:  05/31/16 223 lb 3.2 oz (101.243 kg)  10/21/15 227 lb 3.2 oz (103.057 kg)  10/25/14 210 lb (95.255 kg)      ASSESSMENT AND PLAN:  # Dizziness: Symptoms have resolved and stress test was normal.   # Hyperlipidemia # Obesity:  We discussed the importance of increasing her exercise to at least 30-40 minutes most days of the week. Her ASCVD 10 year risk is 1.7%. Therefore, a statin is not indicated at this time. However, if she does not make some lifestyle interventions she will likely need one in the future. She expressed understanding.  # Sinus bradycardia: Asymptomatic.  No intervention required.  Current medicines are reviewed at length with the patient today.  The patient does not have concerns regarding medicines.  The following changes have been made:  no change  Labs/ tests ordered today include:   No orders of the defined types were placed in this encounter.     Disposition:   FU with Charlena Haub C. Oval Linsey, MD as needed.    Signed, Skeet Latch, MD  05/31/2016 5:11 PM    [place]

## 2016-06-05 MED FILL — PREMPRO 0.625-2.5 MG TABLET: 0.625-2.5 | 84 days supply | Qty: 84 | Fill #1

## 2016-06-15 DIAGNOSIS — L814 Other melanin hyperpigmentation: Secondary | ICD-10-CM | POA: Diagnosis not present

## 2016-06-15 DIAGNOSIS — D1801 Hemangioma of skin and subcutaneous tissue: Secondary | ICD-10-CM | POA: Diagnosis not present

## 2016-06-15 DIAGNOSIS — D225 Melanocytic nevi of trunk: Secondary | ICD-10-CM | POA: Diagnosis not present

## 2016-06-15 DIAGNOSIS — L281 Prurigo nodularis: Secondary | ICD-10-CM | POA: Diagnosis not present

## 2016-06-21 MED FILL — BUPROPION HCL XL 300 MG TAB: 300 | 90 days supply | Qty: 90 | Fill #1

## 2016-06-21 MED FILL — VALACYCLOVIR HCL 500 MG TAB: 500 | 90 days supply | Qty: 90 | Fill #1

## 2016-06-21 MED FILL — METOPROLOL TARTRATE 25 MG T: 25 | 90 days supply | Qty: 180 | Fill #1

## 2016-07-12 DIAGNOSIS — H5702 Anisocoria: Secondary | ICD-10-CM | POA: Diagnosis not present

## 2016-07-12 DIAGNOSIS — H02422 Myogenic ptosis of left eyelid: Secondary | ICD-10-CM | POA: Diagnosis not present

## 2016-07-17 ENCOUNTER — Other Ambulatory Visit: Payer: Self-pay | Admitting: Family Medicine

## 2016-07-17 ENCOUNTER — Other Ambulatory Visit (HOSPITAL_COMMUNITY)
Admission: RE | Admit: 2016-07-17 | Discharge: 2016-07-17 | Disposition: A | Discharge status: Home / Self Care | Payer: 59 | Source: Ambulatory Visit | Attending: Family Medicine | Admitting: Family Medicine

## 2016-07-17 DIAGNOSIS — Z01411 Encounter for gynecological examination (general) (routine) with abnormal findings: Secondary | ICD-10-CM | POA: Diagnosis not present

## 2016-07-17 DIAGNOSIS — F3341 Major depressive disorder, recurrent, in partial remission: Secondary | ICD-10-CM | POA: Diagnosis not present

## 2016-07-17 DIAGNOSIS — Z124 Encounter for screening for malignant neoplasm of cervix: Secondary | ICD-10-CM | POA: Diagnosis not present

## 2016-07-17 DIAGNOSIS — I10 Essential (primary) hypertension: Secondary | ICD-10-CM | POA: Diagnosis not present

## 2016-07-17 DIAGNOSIS — E785 Hyperlipidemia, unspecified: Secondary | ICD-10-CM | POA: Diagnosis not present

## 2016-07-17 DIAGNOSIS — Z1151 Encounter for screening for human papillomavirus (HPV): Secondary | ICD-10-CM | POA: Insufficient documentation

## 2016-07-17 DIAGNOSIS — Z0000 Encounter for general adult medical examination without abnormal findings: Secondary | ICD-10-CM | POA: Diagnosis not present

## 2016-07-19 LAB — CYTOLOGY - PAP

## 2016-07-24 DIAGNOSIS — D2371 Other benign neoplasm of skin of right lower limb, including hip: Secondary | ICD-10-CM | POA: Diagnosis not present

## 2016-07-25 MED FILL — FLUCONAZOLE 150 MG TABLET: 150 | 5 days supply | Qty: 2 | Fill #0

## 2016-08-16 DIAGNOSIS — H4902 Third [oculomotor] nerve palsy, left eye: Secondary | ICD-10-CM | POA: Diagnosis not present

## 2016-08-16 DIAGNOSIS — H5053 Vertical heterophoria: Secondary | ICD-10-CM | POA: Diagnosis not present

## 2016-08-25 ENCOUNTER — Other Ambulatory Visit: Payer: Self-pay | Admitting: Ophthalmology

## 2016-08-25 DIAGNOSIS — H4902 Third [oculomotor] nerve palsy, left eye: Secondary | ICD-10-CM

## 2016-08-25 DIAGNOSIS — H5053 Vertical heterophoria: Secondary | ICD-10-CM

## 2016-08-27 MED FILL — PREMPRO 0.625-2.5 MG TABLET: 0.625-2.5 | 84 days supply | Qty: 84 | Fill #0

## 2016-08-28 ENCOUNTER — Other Ambulatory Visit (HOSPITAL_BASED_OUTPATIENT_CLINIC_OR_DEPARTMENT_OTHER): Payer: Self-pay | Admitting: Ophthalmology

## 2016-08-28 ENCOUNTER — Other Ambulatory Visit: Payer: Self-pay | Admitting: Ophthalmology

## 2016-08-28 DIAGNOSIS — H5053 Vertical heterophoria: Secondary | ICD-10-CM

## 2016-08-28 DIAGNOSIS — H4902 Third [oculomotor] nerve palsy, left eye: Secondary | ICD-10-CM

## 2016-08-30 ENCOUNTER — Other Ambulatory Visit: Payer: 59

## 2016-09-01 ENCOUNTER — Ambulatory Visit (HOSPITAL_BASED_OUTPATIENT_CLINIC_OR_DEPARTMENT_OTHER)
Admission: RE | Admit: 2016-09-01 | Discharge: 2016-09-01 | Disposition: A | Discharge status: Home / Self Care | Payer: 59 | Source: Ambulatory Visit | Attending: Ophthalmology | Admitting: Ophthalmology

## 2016-09-01 DIAGNOSIS — H4902 Third [oculomotor] nerve palsy, left eye: Secondary | ICD-10-CM

## 2016-09-01 DIAGNOSIS — R29810 Facial weakness: Secondary | ICD-10-CM | POA: Diagnosis not present

## 2016-09-01 DIAGNOSIS — H5053 Vertical heterophoria: Secondary | ICD-10-CM | POA: Diagnosis not present

## 2016-09-01 MED ORDER — GADOBENATE DIMEGLUMINE 529 MG/ML IV SOLN: 20.0000 mL | Freq: Once | INTRAVENOUS | Status: DC | PRN

## 2016-09-05 MED FILL — BUPROPION HCL XL 300 MG TAB: 300 | 90 days supply | Qty: 90 | Fill #0

## 2016-09-05 MED FILL — VALACYCLOVIR HCL 500 MG TAB: 500 | 90 days supply | Qty: 90 | Fill #0

## 2016-09-05 MED FILL — METOPROLOL TARTRATE 25 MG T: 25 | 90 days supply | Qty: 180 | Fill #0

## 2016-11-16 DIAGNOSIS — H02539 Eyelid retraction unspecified eye, unspecified lid: Secondary | ICD-10-CM | POA: Diagnosis not present

## 2016-11-19 MED FILL — PREMPRO 0.625-2.5 MG TABLET: 0.625-2.5 | 84 days supply | Qty: 84 | Fill #1

## 2016-12-06 MED FILL — VALACYCLOVIR HCL 500 MG TAB: 500 | 90 days supply | Qty: 90 | Fill #1

## 2016-12-06 MED FILL — METOPROLOL TARTRATE 25 MG T: 25 | 90 days supply | Qty: 180 | Fill #1

## 2016-12-06 MED FILL — BUPROPION HCL XL 300 MG TAB: 300 | 90 days supply | Qty: 90 | Fill #1

## 2017-01-31 DIAGNOSIS — H02403 Unspecified ptosis of bilateral eyelids: Secondary | ICD-10-CM | POA: Diagnosis not present

## 2017-02-13 MED FILL — PREMPRO 0.625-2.5 MG TABLET: 0.625-2.5 | 28 days supply | Qty: 28 | Fill #2

## 2017-02-26 DIAGNOSIS — Z6837 Body mass index (BMI) 37.0-37.9, adult: Secondary | ICD-10-CM | POA: Diagnosis not present

## 2017-02-26 DIAGNOSIS — Z713 Dietary counseling and surveillance: Secondary | ICD-10-CM | POA: Diagnosis not present

## 2017-02-26 DIAGNOSIS — E669 Obesity, unspecified: Secondary | ICD-10-CM | POA: Diagnosis not present

## 2017-02-26 MED FILL — PHENTERMINE 37.5 MG TABLET: 37.5 | 30 days supply | Qty: 30 | Fill #0

## 2017-03-06 MED FILL — METOPROLOL TARTRATE 25 MG T: 25 | 90 days supply | Qty: 180 | Fill #2

## 2017-03-06 MED FILL — BUPROPION HCL XL 300 MG TAB: 300 | 90 days supply | Qty: 90 | Fill #2

## 2017-03-07 MED FILL — PREMPRO 0.625-2.5 MG TABLET: 0.625-2.5 | 28 days supply | Qty: 28 | Fill #3

## 2017-03-08 MED FILL — VALACYCLOVIR HCL 500 MG TAB: 500 | 90 days supply | Qty: 90 | Fill #0

## 2017-04-10 MED FILL — PHENTERMINE 37.5 MG TABLET: 37.5 | 30 days supply | Qty: 30 | Fill #1

## 2017-04-24 DIAGNOSIS — Z6836 Body mass index (BMI) 36.0-36.9, adult: Secondary | ICD-10-CM | POA: Diagnosis not present

## 2017-05-13 MED FILL — BELVIQ 10 MG TABLET: 10 | 30 days supply | Qty: 60 | Fill #0

## 2017-05-14 IMAGING — MR MR MRA HEAD W/O CM
1 series · 20 of 48 positions shown · non-contrast
Comparison: None.

CLINICAL DATA: Left third nerve palsy. Hyperphoria. Left eyelid
drooping for almost 1 year. Left pupil dilation.

EXAM:
MRA HEAD WITHOUT CONTRAST
TECHNIQUE: Angiographic images of the Circle of Willis were obtained using MRA
technique without intravenous contrast.

[Series 3: tof_3d_multi-slab · axial · 0.5mm · 0.35mm/px · z∈[-48,+42]mm · 20 of 191 slices shown]
[im 1/191]
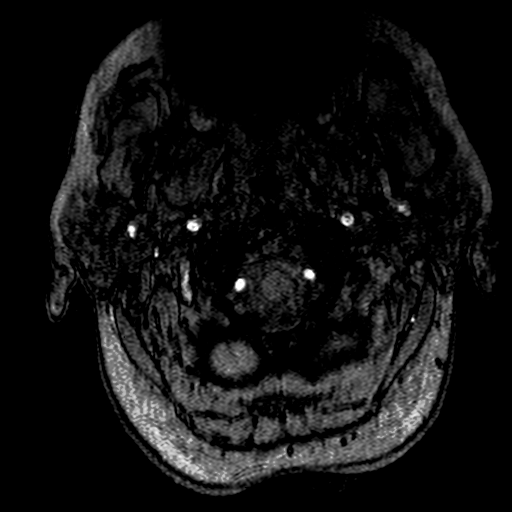
[im 5/191]
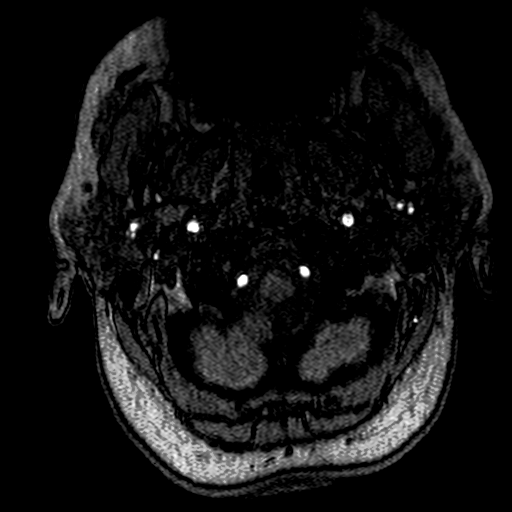
[im 9/191]
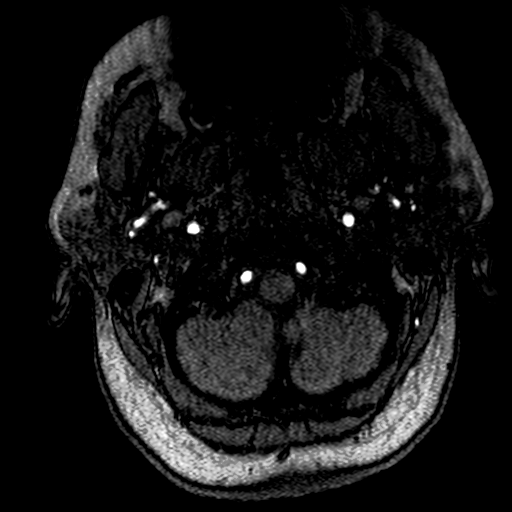
[im 13/191]
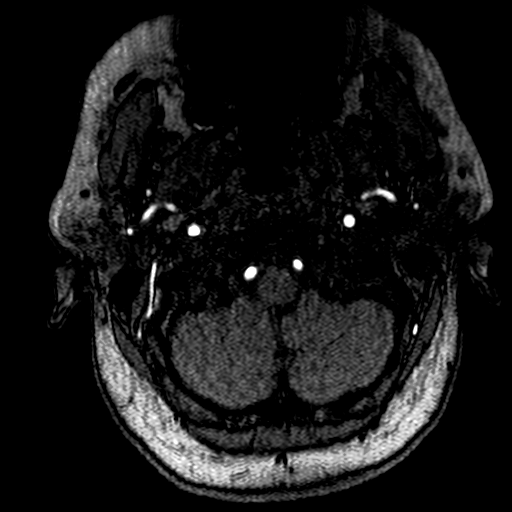
[im 17/191]
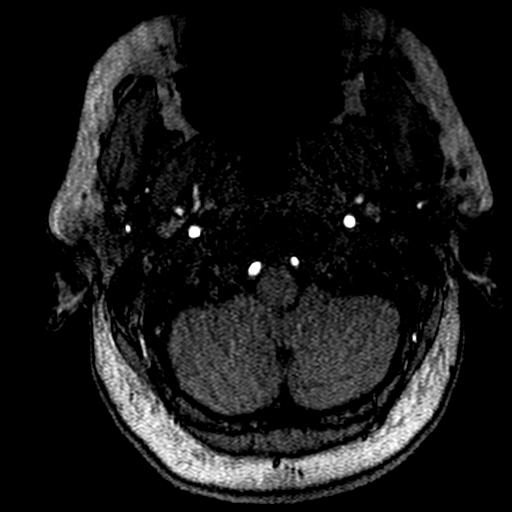
[im 21/191]
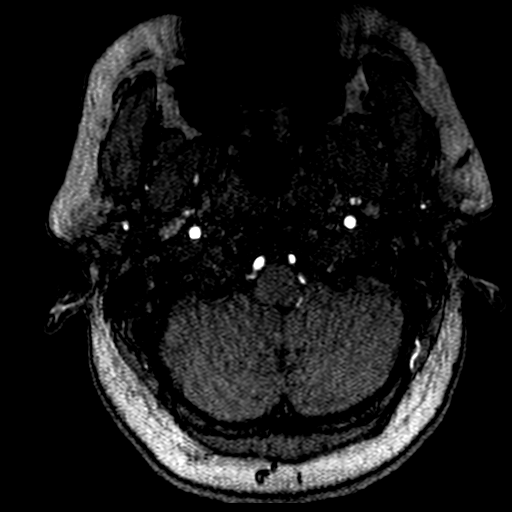
[im 25/191]
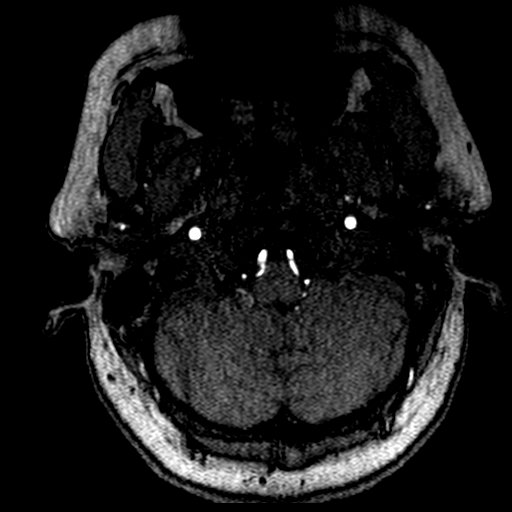
[im 29/191]
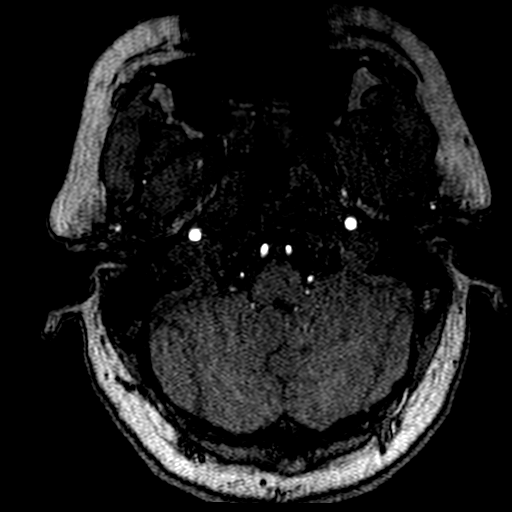
[im 33/191]
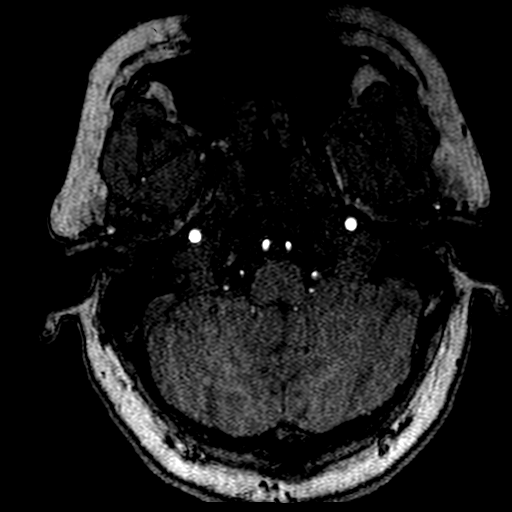
[im 37/191]
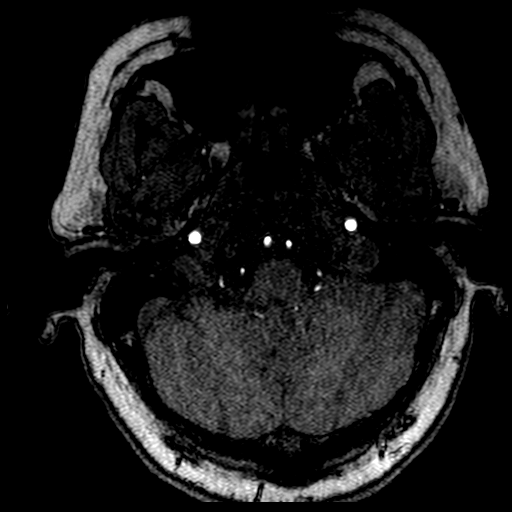
[im 41/191]
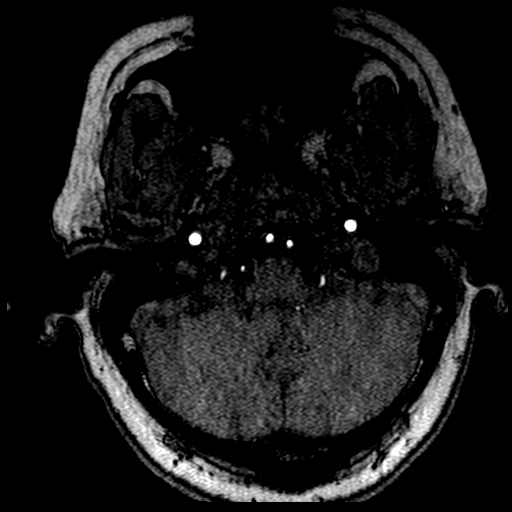
[im 45/191]
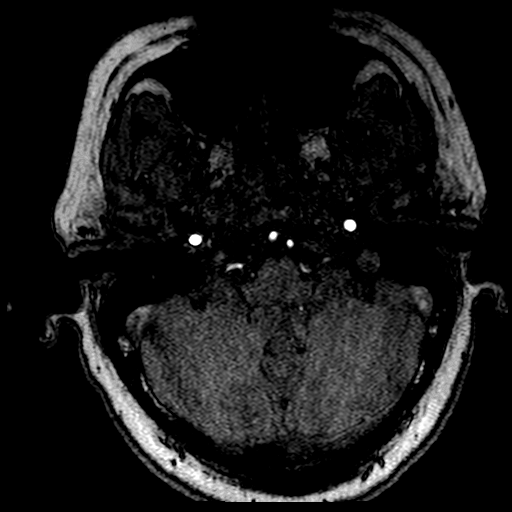
[im 61/191]
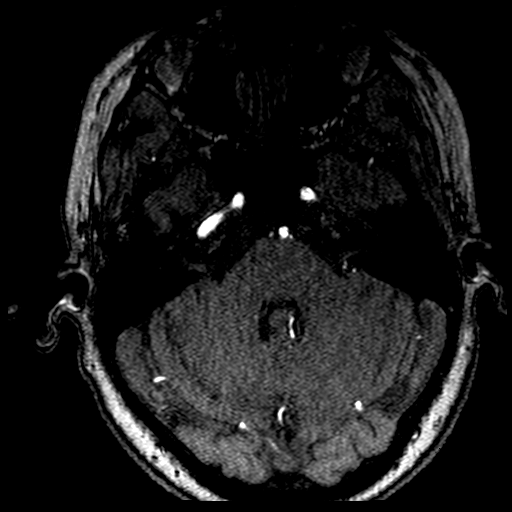
[im 85/191]
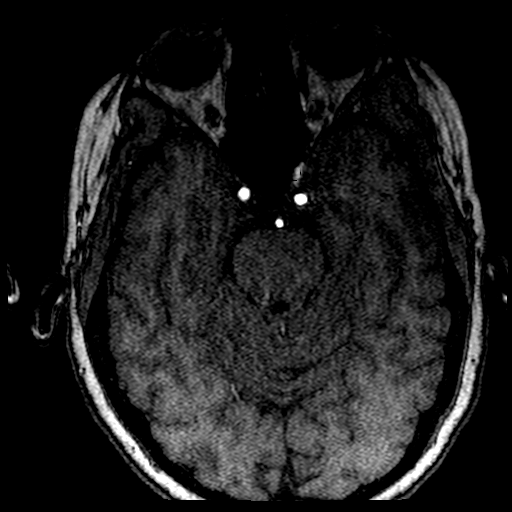
[im 98/191]
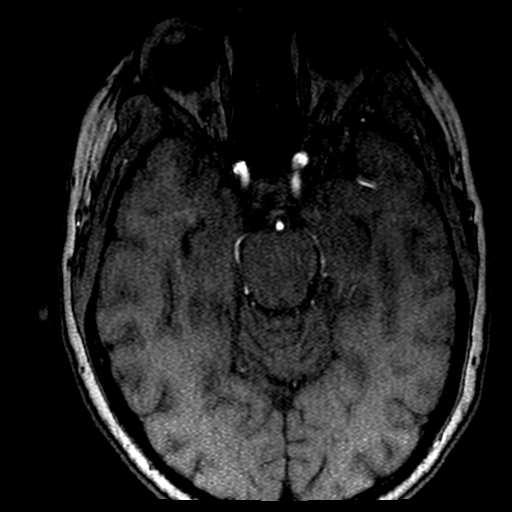
[im 110/191]
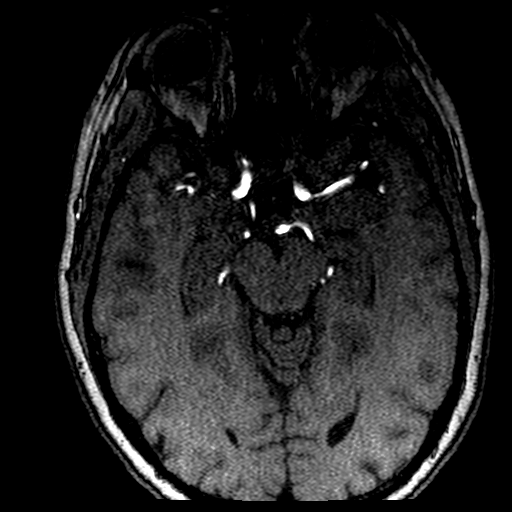
[im 134/191]
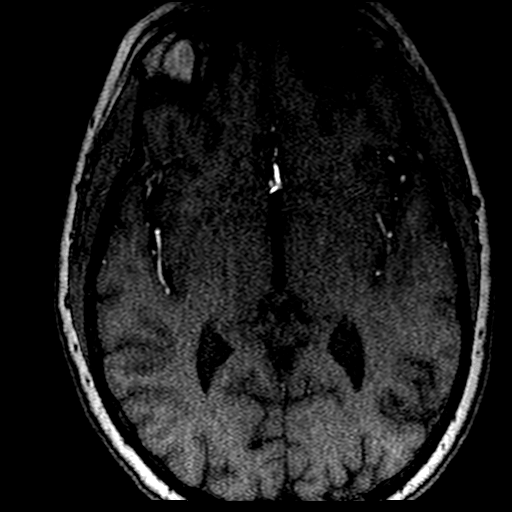
[im 158/191]
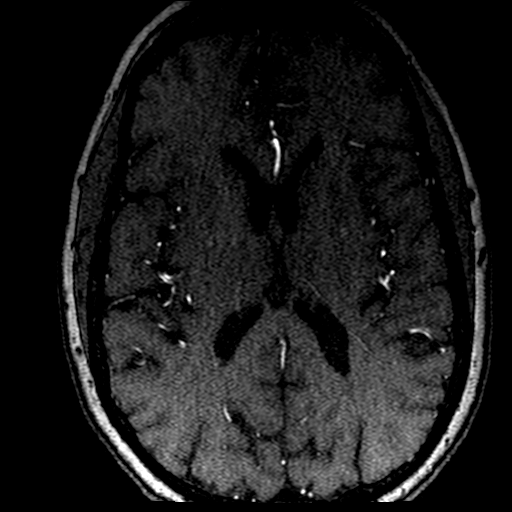
[im 162/191]
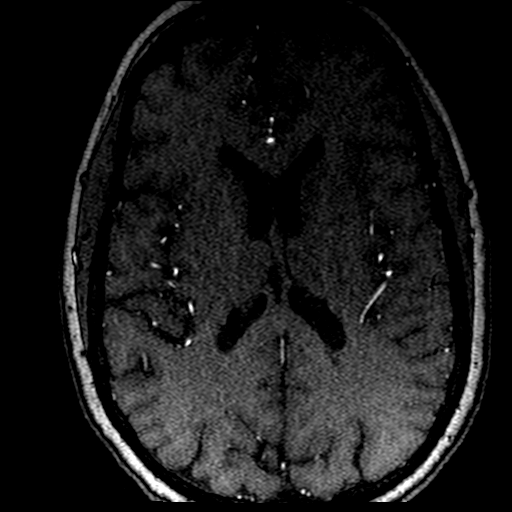
[im 182/191]
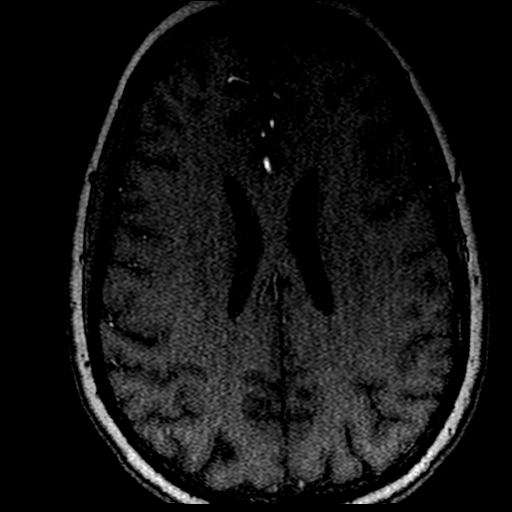

[20 of 48 positions shown; findings below may reference images not displayed]

FINDINGS: The visualized distal vertebral arteries are widely patent to the
basilar with the right being mildly dominant. PICA, AICA, and SCA
origins are patent. Basilar artery is widely patent. There is a
medium-sized right posterior communicating artery. There is likely a
tiny left posterior communicating artery. PCAs are patent without
evidence of major branch occlusion or significant proximal stenosis.

The internal carotid arteries are widely patent from skullbase to
carotid termini. The left ACA is dominant. ACAs and MCAs are patent
without evidence of major branch occlusion. No intracranial aneurysm
is identified.
IMPRESSION: Unremarkable MRA.  No aneurysm identified.

## 2017-05-24 DIAGNOSIS — Z209 Contact with and (suspected) exposure to unspecified communicable disease: Secondary | ICD-10-CM | POA: Diagnosis not present

## 2017-05-28 MED FILL — METOPROLOL TARTRATE 25 MG T: 25 | 90 days supply | Qty: 180 | Fill #3

## 2017-05-28 MED FILL — VALACYCLOVIR HCL 500 MG TAB: 500 | 90 days supply | Qty: 90 | Fill #1

## 2017-05-28 MED FILL — BUPROPION HCL XL 300 MG TAB: 300 | 90 days supply | Qty: 90 | Fill #3

## 2017-06-10 MED FILL — BELVIQ 10 MG TABLET: 10 | 30 days supply | Qty: 60 | Fill #1

## 2017-06-20 DIAGNOSIS — D1801 Hemangioma of skin and subcutaneous tissue: Secondary | ICD-10-CM | POA: Diagnosis not present

## 2017-06-20 DIAGNOSIS — L814 Other melanin hyperpigmentation: Secondary | ICD-10-CM | POA: Diagnosis not present

## 2017-06-20 DIAGNOSIS — L821 Other seborrheic keratosis: Secondary | ICD-10-CM | POA: Diagnosis not present

## 2017-06-28 DIAGNOSIS — Z79899 Other long term (current) drug therapy: Secondary | ICD-10-CM | POA: Diagnosis not present

## 2017-06-28 DIAGNOSIS — E669 Obesity, unspecified: Secondary | ICD-10-CM | POA: Diagnosis not present

## 2017-07-09 MED FILL — BELVIQ 10 MG TABLET: 10 | 30 days supply | Qty: 60 | Fill #0

## 2017-07-23 DIAGNOSIS — Z0000 Encounter for general adult medical examination without abnormal findings: Secondary | ICD-10-CM | POA: Diagnosis not present

## 2017-07-23 DIAGNOSIS — F3341 Major depressive disorder, recurrent, in partial remission: Secondary | ICD-10-CM | POA: Diagnosis not present

## 2017-07-23 DIAGNOSIS — E785 Hyperlipidemia, unspecified: Secondary | ICD-10-CM | POA: Diagnosis not present

## 2017-07-23 DIAGNOSIS — I10 Essential (primary) hypertension: Secondary | ICD-10-CM | POA: Diagnosis not present

## 2017-07-30 ENCOUNTER — Other Ambulatory Visit: Payer: Self-pay | Admitting: Family Medicine

## 2017-07-30 DIAGNOSIS — Z1231 Encounter for screening mammogram for malignant neoplasm of breast: Secondary | ICD-10-CM

## 2017-08-08 MED FILL — buPROPion HCL ER (XL) 300 M: 300 | 90 days supply | Qty: 90 | Fill #0

## 2017-08-08 MED FILL — VALACYCLOVIR HCL 500 MG TAB: 500 | 90 days supply | Qty: 90 | Fill #2

## 2017-08-08 MED FILL — METOPROLOL TARTRATE 25 MG T: 25 | 90 days supply | Qty: 180 | Fill #0

## 2017-08-08 MED FILL — BELVIQ 10 MG TABLET: 10 | 30 days supply | Qty: 60 | Fill #1

## 2017-08-27 ENCOUNTER — Ambulatory Visit
Admission: RE | Admit: 2017-08-27 | Discharge: 2017-08-27 | Disposition: A | Discharge status: Home / Self Care | Payer: 59 | Source: Ambulatory Visit | Attending: Family Medicine | Admitting: Family Medicine

## 2017-08-27 DIAGNOSIS — Z1231 Encounter for screening mammogram for malignant neoplasm of breast: Secondary | ICD-10-CM

## 2017-09-11 MED FILL — BELVIQ 10 MG TABLET: 10 | 30 days supply | Qty: 60 | Fill #2

## 2017-10-15 MED FILL — BELVIQ 10 MG TABLET: 10 | 30 days supply | Qty: 60 | Fill #3

## 2017-11-11 MED FILL — BELVIQ 10 MG TABLET: 10 | 30 days supply | Qty: 60 | Fill #4

## 2017-11-11 MED FILL — VALACYCLOVIR HCL 500 MG TAB: 500 | 90 days supply | Qty: 90 | Fill #3

## 2017-11-11 MED FILL — METOPROLOL TARTRATE 25 MG T: 25 | 90 days supply | Qty: 180 | Fill #1

## 2017-11-11 MED FILL — BUPROPION HCL XL 300 MG TAB: 300 | 90 days supply | Qty: 90 | Fill #1

## 2017-12-09 MED FILL — BELVIQ 10 MG TABLET: 10 | 30 days supply | Qty: 60 | Fill #5

## 2018-01-13 MED FILL — BELVIQ 10 MG TABLET: 10 | 30 days supply | Qty: 60 | Fill #0

## 2018-01-31 DIAGNOSIS — H5201 Hypermetropia, right eye: Secondary | ICD-10-CM | POA: Diagnosis not present

## 2018-01-31 DIAGNOSIS — H5212 Myopia, left eye: Secondary | ICD-10-CM | POA: Diagnosis not present

## 2018-01-31 DIAGNOSIS — H04123 Dry eye syndrome of bilateral lacrimal glands: Secondary | ICD-10-CM | POA: Diagnosis not present

## 2018-02-03 DIAGNOSIS — E785 Hyperlipidemia, unspecified: Secondary | ICD-10-CM | POA: Diagnosis not present

## 2018-02-03 DIAGNOSIS — I10 Essential (primary) hypertension: Secondary | ICD-10-CM | POA: Diagnosis not present

## 2018-02-03 DIAGNOSIS — F3341 Major depressive disorder, recurrent, in partial remission: Secondary | ICD-10-CM | POA: Diagnosis not present

## 2018-02-11 MED FILL — METOPROLOL TARTRATE 25 MG T: 25 | 90 days supply | Qty: 180 | Fill #2

## 2018-02-11 MED FILL — BUPROPION HCL XL 300 MG TAB: 300 | 90 days supply | Qty: 90 | Fill #2

## 2018-02-14 MED FILL — VALACYCLOVIR HCL 500 MG TAB: 500 | 90 days supply | Qty: 90 | Fill #0

## 2018-02-14 MED FILL — BELVIQ 10 MG TABLET: 10 | 30 days supply | Qty: 60 | Fill #1

## 2018-03-17 MED FILL — BELVIQ 10 MG TABLET: 10 | 30 days supply | Qty: 60 | Fill #2

## 2018-04-25 MED FILL — BELVIQ 10 MG TABLET: 10 | 30 days supply | Qty: 60 | Fill #3

## 2018-05-22 MED FILL — VALACYCLOVIR HCL 500 MG TAB: 500 | 90 days supply | Qty: 90 | Fill #1

## 2018-06-05 MED FILL — METOPROLOL TARTRATE 25 MG T: 25 | 90 days supply | Qty: 180 | Fill #3

## 2018-06-05 MED FILL — BUPROPION HCL XL 300 MG TAB: 300 | 90 days supply | Qty: 90 | Fill #3

## 2018-06-06 MED FILL — BELVIQ 10 MG TABLET: 10 | 30 days supply | Qty: 60 | Fill #4

## 2018-07-01 DIAGNOSIS — D225 Melanocytic nevi of trunk: Secondary | ICD-10-CM | POA: Diagnosis not present

## 2018-07-01 DIAGNOSIS — L814 Other melanin hyperpigmentation: Secondary | ICD-10-CM | POA: Diagnosis not present

## 2018-07-01 DIAGNOSIS — L821 Other seborrheic keratosis: Secondary | ICD-10-CM | POA: Diagnosis not present

## 2018-07-18 MED FILL — BELVIQ 10 MG TABLET: 10 | 30 days supply | Qty: 60 | Fill #0

## 2018-07-28 DIAGNOSIS — Z0000 Encounter for general adult medical examination without abnormal findings: Secondary | ICD-10-CM | POA: Diagnosis not present

## 2018-07-28 DIAGNOSIS — F3341 Major depressive disorder, recurrent, in partial remission: Secondary | ICD-10-CM | POA: Diagnosis not present

## 2018-07-28 DIAGNOSIS — I10 Essential (primary) hypertension: Secondary | ICD-10-CM | POA: Diagnosis not present

## 2018-07-28 DIAGNOSIS — E785 Hyperlipidemia, unspecified: Secondary | ICD-10-CM | POA: Diagnosis not present

## 2018-08-05 ENCOUNTER — Other Ambulatory Visit: Payer: Self-pay | Admitting: Family Medicine

## 2018-08-05 DIAGNOSIS — Z1231 Encounter for screening mammogram for malignant neoplasm of breast: Secondary | ICD-10-CM

## 2018-08-18 MED FILL — BUPROPION HCL XL 300 MG TAB: 300 | 90 days supply | Qty: 90 | Fill #0

## 2018-08-18 MED FILL — VALACYCLOVIR HCL 500 MG TAB: 500 | 90 days supply | Qty: 90 | Fill #2

## 2018-08-18 MED FILL — BELVIQ 10 MG TABLET: 10 | 30 days supply | Qty: 60 | Fill #1

## 2018-08-25 DIAGNOSIS — R7309 Other abnormal glucose: Secondary | ICD-10-CM | POA: Diagnosis not present

## 2018-08-25 DIAGNOSIS — R5383 Other fatigue: Secondary | ICD-10-CM | POA: Diagnosis not present

## 2018-09-05 MED FILL — METOPROLOL TARTRATE 25 MG T: 25 | 90 days supply | Qty: 180 | Fill #0

## 2018-09-08 ENCOUNTER — Ambulatory Visit: Payer: 59

## 2018-09-09 ENCOUNTER — Ambulatory Visit
Admission: RE | Admit: 2018-09-09 | Discharge: 2018-09-09 | Disposition: A | Discharge status: Home / Self Care | Payer: 59 | Source: Ambulatory Visit | Attending: Family Medicine | Admitting: Family Medicine

## 2018-09-09 DIAGNOSIS — Z1231 Encounter for screening mammogram for malignant neoplasm of breast: Secondary | ICD-10-CM | POA: Diagnosis not present

## 2018-09-17 MED FILL — BELVIQ 10 MG TABLET: 10 | 30 days supply | Qty: 60 | Fill #2

## 2018-10-15 MED FILL — BELVIQ 10 MG TABLET: 10 | 30 days supply | Qty: 60 | Fill #3

## 2018-11-17 MED FILL — METOPROLOL TARTRATE 25 MG T: 25 | 90 days supply | Qty: 180 | Fill #1

## 2018-11-17 MED FILL — BuPROPion HCL ER (XL) 300 M: 300 | 90 days supply | Qty: 90 | Fill #1

## 2018-11-17 MED FILL — BELVIQ 10 MG TABLET: 10 | 30 days supply | Qty: 60 | Fill #4

## 2018-11-19 MED FILL — VALACYCLOVIR HCL 500 MG TAB: 500 | 90 days supply | Qty: 90 | Fill #0

## 2018-12-05 ENCOUNTER — Encounter: Payer: 59 | Admitting: Plastic Surgery

## 2019-02-19 MED FILL — buPROPion HCL ER (XL) 300 M: 300 | 90 days supply | Qty: 90 | Fill #2

## 2019-02-19 MED FILL — VALACYCLOVIR HCL 500 MG TAB: 500 | 90 days supply | Qty: 90 | Fill #1

## 2019-02-19 MED FILL — METOPROLOL TARTRATE 25 MG T: 25 | 90 days supply | Qty: 180 | Fill #2

## 2019-05-18 MED FILL — METOPROLOL TARTRATE 25 MG T: 25 | 90 days supply | Qty: 180 | Fill #0

## 2019-05-18 MED FILL — VALACYCLOVIR HCL 500 MG TAB: 500 | 90 days supply | Qty: 90 | Fill #0

## 2019-05-18 MED FILL — buPROPion HCL ER (XL) 300 M: 300 | 90 days supply | Qty: 90 | Fill #0

## 2019-06-16 ENCOUNTER — Ambulatory Visit (HOSPITAL_COMMUNITY)
Admission: EM | Admit: 2019-06-16 | Discharge: 2019-06-16 | Disposition: A | Discharge status: Home / Self Care | Payer: No Typology Code available for payment source | Attending: Emergency Medicine | Admitting: Emergency Medicine

## 2019-06-16 ENCOUNTER — Encounter (HOSPITAL_COMMUNITY): Payer: Self-pay

## 2019-06-16 ENCOUNTER — Ambulatory Visit (INDEPENDENT_AMBULATORY_CARE_PROVIDER_SITE_OTHER): Payer: No Typology Code available for payment source

## 2019-06-16 ENCOUNTER — Other Ambulatory Visit: Payer: Self-pay

## 2019-06-16 DIAGNOSIS — R0781 Pleurodynia: Secondary | ICD-10-CM | POA: Diagnosis not present

## 2019-06-16 LAB — POCT URINALYSIS DIP (DEVICE)
Bilirubin Urine: NEGATIVE
Glucose, UA: NEGATIVE mg/dL
Hgb urine dipstick: NEGATIVE
Ketones, ur: NEGATIVE mg/dL
Leukocytes,Ua: NEGATIVE
Nitrite: NEGATIVE
Protein, ur: NEGATIVE mg/dL
Specific Gravity, Urine: 1.01 (ref 1.005–1.030)
Urobilinogen, UA: 0.2 mg/dL (ref 0.0–1.0)
pH: 7 (ref 5.0–8.0)

## 2019-06-16 MED ORDER — IBUPROFEN 600 MG PO TABS: 600.0000 mg | ORAL_TABLET | Freq: Four times a day (QID) | ORAL | 0 refills | Status: DC | PRN

## 2019-06-16 NOTE — ED Provider Notes (Signed)
[facility]    CSN: [id number] Arrival date & time: 06/16/19  1522      History   Chief Complaint Chief Complaint  Patient presents with   rib pain    HPI [name] is a 53 y.o. female history of hypertension, hyperlipidemia, kidney stones, presenting today for evaluation of possible rib injury.  Patient states that 3 days ago over the weekend she was cleaning out a stock pan and was leaning over, she felt a pop and pressure sensation below her left breast.  Since she has had persistent discomfort.  Earlier today she had increased pain and pressure and felt slightly short of breath.  She denies any urinary symptoms of dysuria, increased frequency or hematuria.  Denies back pain associated with this.  Denies abdominal pain.  Denies nausea or vomiting.  Has been eating and drinking like normal.  Denies associated cough fevers chills or body aches.  She has taken ibuprofen 400 mg as well as some Tylenol.  HPI  Past Medical History:  Diagnosis Date   Dizziness 10/21/2015   Essential hypertension 10/21/2015   Hyperlipidemia 10/21/2015   Hypertension    Kidney calculi    Shortness of breath 10/21/2015    Patient Active Problem List   Diagnosis Date Noted   Essential hypertension 10/21/2015   Hyperlipidemia 10/21/2015   Dizziness 10/21/2015   Shortness of breath 10/21/2015    History reviewed. No pertinent surgical history.  OB History   No obstetric history on file.      Home Medications    Prior to Admission medications   Medication Sig Start Date End Date Taking? Authorizing Provider  aspirin 81 MG tablet Take 81 mg by mouth daily.    [provider]  buPROPion (WELLBUTRIN XL) 300 MG 24 hr tablet Take 300 mg by mouth daily.    [provider]  estrogen, conjugated,-medroxyprogesterone (PREMPRO) 0.625-2.5 MG per tablet Take 1 tablet by mouth daily.    [provider]  fexofenadine (ALLEGRA) 180 MG tablet Take  180 mg by mouth daily.    [provider]  fish oil-omega-3 fatty acids 1000 MG capsule Take 2 g by mouth daily.    [provider]  ibuprofen (ADVIL) 600 MG tablet Take 1 tablet (600 mg total) by mouth every 6 (six) hours as needed. 06/16/19   Janelly Switalski C, PA-C  Melatonin-Pyridoxine (MELATIN PO) Take 1 tablet by mouth at bedtime as needed. For sleep    [provider]  metoprolol tartrate (LOPRESSOR) 25 MG tablet Take 25 mg by mouth 2 (two) times daily.    [provider]  valACYclovir (VALTREX) 500 MG tablet Take 500 mg by mouth 2 (two) times daily as needed.    [provider]    Family History Family History  Problem Relation Age of Onset   CAD Brother    Atrial fibrillation Sister    Breast cancer Neg Hx     Social History Social History   Tobacco Use   Smoking status: Never Smoker   Smokeless tobacco: Never Used  Substance Use Topics   Alcohol use: Yes   Drug use: No     Allergies   Patient has no known allergies.   Review of Systems Review of Systems  Constitutional: Negative for fatigue and fever.  HENT: Negative for mouth sores.   Eyes: Negative for visual disturbance.  Respiratory: Negative for shortness of breath.   Cardiovascular: Positive for chest pain.  Gastrointestinal: Negative for  abdominal pain, nausea and vomiting.  Genitourinary: Negative for genital sores.  Musculoskeletal: Negative for arthralgias and joint swelling.  Skin: Negative for color change, rash and wound.  Neurological: Negative for dizziness, weakness, light-headedness and headaches.     Physical Exam Triage Vital Signs ED Triage Vitals  Enc Vitals Group     BP 06/16/19 1545 (!) 147/83     Pulse Rate 06/16/19 1545 64     Resp 06/16/19 1545 18     Temp 06/16/19 1545 98.2 F (36.8 C)     Temp src --      SpO2 06/16/19 1545 98 %     Weight 06/16/19 1543 220 lb (99.8 kg)     Height --      Head Circumference --       Peak Flow --      Pain Score 06/16/19 1542 2     Pain Loc --      Pain Edu? --      Excl. in [place]? --    No data found.  Updated Vital Signs BP (!) 147/83    Pulse 64    Temp 98.2 F (36.8 C)    Resp 18    Wt 220 lb (99.8 kg)    SpO2 98%    BMI 36.61 kg/m   Visual Acuity Right Eye Distance:   Left Eye Distance:   Bilateral Distance:    Right Eye Near:   Left Eye Near:    Bilateral Near:     Physical Exam Vitals signs and nursing note reviewed.  Constitutional:      Appearance: She is well-developed.     Comments: No acute distress  HENT:     Head: Normocephalic and atraumatic.     Nose: Nose normal.  Eyes:     Conjunctiva/sclera: Conjunctivae normal.  Neck:     Musculoskeletal: Neck supple.  Cardiovascular:     Rate and Rhythm: Normal rate.  Pulmonary:     Effort: Pulmonary effort is normal. No respiratory distress.     Comments: Breathing comfortably at rest, CTABL, no wheezing, rales or other adventitious sounds auscultated Abdominal:     General: There is no distension.  Musculoskeletal: Normal range of motion.     Comments: Nontender to palpation throughout anterior chest/underneath left breast/ribs  Skin:    General: Skin is warm and dry.     Comments: No overlying skin changes, discoloration or bruising.  Neurological:     Mental Status: She is alert and oriented to person, place, and time.      UC Treatments / Results  Labs (all labs ordered are listed, but only abnormal results are displayed) Labs Reviewed  POCT URINALYSIS DIP (DEVICE)    EKG None  Radiology Dg Ribs Unilateral W/chest Left  Result Date: 06/16/2019 CLINICAL DATA:  Left lower anterior rib pain. EXAM: LEFT RIBS AND CHEST - 3+ VIEW COMPARISON:  Radiographs dated 09/23/2015 FINDINGS: There is no discrete rib fracture. The marked area of pain is in the region of the noncalcified anterior costal cartilage. Heart size and pulmonary vascularity are normal. Chronic calcified granuloma in  the right mid lung zone. No infiltrates or effusions. IMPRESSION: No visible rib fracture or other acute abnormality. Electronically Signed   By: Lorriane Shire M.D.   On: 06/16/2019 17:15    Procedures Procedures (including critical care time)  Medications Ordered in UC Medications - No data to display  Initial Impression / Assessment and Plan / UC Course  I  have reviewed the triage vital signs and the nursing notes.  Pertinent labs & imaging results that were available during my care of the patient were reviewed by me and considered in my medical decision making (see chart for details).    X-ray negative for rib fracture.  Lungs clear.  No overlying skin changes.  Seems related to injury on Sunday.  Most likely muscular.  Do not suspect cardiac etiology.  Will recommend continued anti-inflammatories and monitoring.  Urine negative for hemoglobin or signs of infection.  Final Clinical Impressions(s) / UC Diagnoses   Final diagnoses:  Rib pain on left side     Discharge Instructions     No fracture noted on xray  Use anti-inflammatories for pain/swelling. You may take up to 800 mg Ibuprofen every 8 hours with food. You may supplement Ibuprofen with Tylenol [phone] mg every 8 hours.  Urine completely normal, no blood or sign of infection.   ED Prescriptions    Medication Sig Dispense Auth. Provider   ibuprofen (ADVIL) 600 MG tablet Take 1 tablet (600 mg total) by mouth every 6 (six) hours as needed. 30 tablet Tarahji Ramthun, [place] C, PA-C     Controlled Substance Prescriptions [place] Controlled Substance Registry consulted? Not Applicable   Janith Lima, [place] 06/16/19 1834

## 2019-06-16 NOTE — Discharge Instructions (Addendum)
No fracture noted on xray  Use anti-inflammatories for pain/swelling. You may take up to 800 mg Ibuprofen every 8 hours with food. You may supplement Ibuprofen with Tylenol [phone] mg every 8 hours.  Urine completely normal, no blood or sign of infection.

## 2019-06-16 NOTE — ED Triage Notes (Signed)
Pt states she was reaching over a tank and she heard something snapped around her rib area. Pt states she drank a lot of drink and the pain kinda went away.

## 2019-06-17 MED FILL — IBUPROFEN 600 MG TABLET: 600 | 8 days supply | Qty: 30 | Fill #0

## 2019-08-10 MED FILL — PHENTERMINE 37.5 MG TABLET: 37.5 | 30 days supply | Qty: 30 | Fill #0

## 2019-08-12 ENCOUNTER — Other Ambulatory Visit: Payer: Self-pay | Admitting: Family Medicine

## 2019-08-12 DIAGNOSIS — Z1231 Encounter for screening mammogram for malignant neoplasm of breast: Secondary | ICD-10-CM

## 2019-08-26 MED FILL — VALACYCLOVIR HCL 500 MG TAB: 500 | 90 days supply | Qty: 90 | Fill #0

## 2019-08-26 MED FILL — METOPROLOL TARTRATE 25 MG T: 25 | 90 days supply | Qty: 180 | Fill #0

## 2019-08-26 MED FILL — buPROPion HCL ER (XL) 300 M: 300 | 90 days supply | Qty: 90 | Fill #0

## 2019-09-11 MED FILL — PHENTERMINE 37.5 MG TABLET: 37.5 | 30 days supply | Qty: 30 | Fill #1

## 2019-09-18 ENCOUNTER — Ambulatory Visit
Admission: RE | Admit: 2019-09-18 | Discharge: 2019-09-18 | Disposition: A | Discharge status: Home / Self Care | Payer: No Typology Code available for payment source | Source: Ambulatory Visit | Attending: Family Medicine | Admitting: Family Medicine

## 2019-09-18 ENCOUNTER — Other Ambulatory Visit: Payer: Self-pay

## 2019-09-18 DIAGNOSIS — Z1231 Encounter for screening mammogram for malignant neoplasm of breast: Secondary | ICD-10-CM

## 2019-09-23 MED FILL — ADAPALENE 0.3% GEL: 0.3 | 30 days supply | Qty: 45 | Fill #0

## 2019-09-24 MED FILL — FLUCONAZOLE 150 MG TABLET: 150 | 1 days supply | Qty: 1 | Fill #0

## 2019-10-09 MED FILL — PHENTERMINE 37.5 MG TABLET: 37.5 | 30 days supply | Qty: 30 | Fill #2

## 2019-11-12 MED FILL — PHENTERMINE 37.5 MG TABLET: 37.5 | 30 days supply | Qty: 30 | Fill #0

## 2019-11-12 MED FILL — SPIRONOLACTONE 50 MG TABLET: 50 | 30 days supply | Qty: 30 | Fill #0

## 2019-11-18 MED FILL — METOPROLOL TARTRATE 25 MG T: 25 | 90 days supply | Qty: 180 | Fill #1

## 2019-11-18 MED FILL — buPROPion HCL ER (XL) 300 M: 300 | 90 days supply | Qty: 90 | Fill #1

## 2019-11-19 ENCOUNTER — Telehealth: Payer: No Typology Code available for payment source | Admitting: Physician Assistant

## 2019-11-19 DIAGNOSIS — R21 Rash and other nonspecific skin eruption: Secondary | ICD-10-CM | POA: Diagnosis not present

## 2019-11-19 MED FILL — VALACYCLOVIR HCL 500 MG TAB: 500 | 90 days supply | Qty: 90 | Fill #0

## 2019-11-19 NOTE — Progress Notes (Signed)
Hello [name]!  Im sorry you are going through this. It looks like the medication you are using has caused your skin to be extremely sensitive to light, this was likely the differin gel. The area of the rash looks to be sun exposed areas. I think using calamine lotion would be ok if it improves your symptoms. This should improve with time. I would reach out to your dermatologist and have them review the images and see if they have any further suggestions for symptomatic care. I have attached some generic information on sunburn below. Take care   Lenn Sink PA-C    We are sorry that you are not feeling well.  Here is how we plan to help!  Based on what you have shared with me, I'd like to share with you a treatment plan for sunburn.   Most sunburn is a first degree burn that turns the skin pink or red.  It can be painful to touch.  If you stayed in the sun for a prolonged period this might have progressed to a second degree burn with blistering!  Usually the pain and swelling starts after about 4 hours, peaks at 24 hours and begins to improve after 48 hours or about 2 days.  REMEMBER prolonged exposure to the sun increases your risk of skin cancer so use sunscreen before you go outside!  We will give you more information about sunscreen use later in your care plan.  Your sunburn can be managed by self-care at home.  Please use the following care guide to manage your sunburn.  If you symptoms worsen, you have other questions or concerns, or you develop any of the warnings signs listed in your care plan you will need to seek a face to face visit with a provider without waiting!  Home Care Advice for Treating Mild Sunburn:  1. Take Ibuprofen (Advil, Motrin) for pain relief as soon as possible.  The adult dosage is up to 600 mg every 6 hours.  Starting within 6 hours of sun exposure may greatly reduce your discomfort.  If you cannot take Ibuprofen you may use Acetaminophen instead.  Do not take  Ibuprofen if you have stomach problems, kidney disease or are pregnant.  Do not take Ibuprofen if you have been told by your doctor or pharmacist to avoid this class of drugs.  Do not take Acetaminophen if you have liver disease.  Read the package warnings on any medication that you take!  2.  Use a steroid cream on the affected skin.  If you apply an over the counter steroid       cream as soon as possible and repeat it three times a day it may reduce the pain and      and swelling.  Until you get the steroid cream you may start with a moistening cream      cream or aloe gel.  3. For second or third degree sunburn with painful blistering, you can use an over the         counter product Burn Jel Plus Pain Relieving Gel. Apply in a thick even layer over the     affected area not more than 3 to 4 times daily If you need to cover the area to protect      it from friction of clothing, you can use Moist Burn Pads such as Hydrogel Burn Pads       which are available over the counter.  4.  Apply cool  compresses to the burned areas several times a day.  5.  Avoid soap on the sunburned areas.  6.  Drink plenty of water.  It is easy to get dehydrated from prolong time in the sun      Outdoors.  7.  For any broken blisters:  Trim off the dead skin with fine scissors.  It is wise to clean the scissor with alcohol before use.  Apply antibiotic ointments to the blister.  Apply twice a day for three days.  There are triple antibiotic ointments with topical pain relievers available at stores.  Caution:leave intact blisters alone.  They are protecting the skin and will allow it to heal.  8.  Taking Vitamin C orally may reduce sun damage to your skin.  Follow the      Instructions on the bottle.  The recommended adult dosage is 2 grams.  What to Expect:  1. Pain usually stops after 2 or 3 days. 2. Skin flaking and peeling usually occurs for 3-7 days after a sunburn.  Call your provider  if:  1. You feel very weak or have difficulty standing. 2. Blister develops on your face. 3. You become sensitive to light because of eye pain. 4. Your skin looks infected (red streaks, puss or worsening tenderness after 48 hours. 5. You feel you should be seen.  Preventing Sunburns:  1. Apply 20-30 SPF sunscreen to your skin before going into the sun. 2. Reapply every 2-4 hours or after sweating or swimming. 3. Sunscreens protect from sunburns but do not completely prevent skin damage.  Nancy Fetter exposure still increases your risk of premature aging and skin cancers.  Your e-visit answers were reviewed by a board certified advanced clinical practitioner to complete your personal care plan.  Depending on the condition, your plan could have included both over the counter or prescription medications.  If there is a problem please reply  once you have received a response from your provider.  Your safety is important to [place].  If you have drug allergies check your prescription carefully.    You can use MyChart to ask questions about today's visit, request a non-urgent call back, or ask for a work or school excuse for 24 hours related to this e-Visit. If it has been greater than 24 hours you will need to follow up with your provider, or enter a new e-Visit to address those concerns.  You will get an e-mail in the next two days asking about your experience.  I hope that your e-visit has been valuable and will speed your recovery. Thank you for using e-visits.   Greater than 5 minutes, yet less than 10 minutes of time have been spent researching, coordinating, and implementing care for this patient today

## 2019-12-07 MED FILL — PHENTERMINE 37.5 MG TABLET: 37.5 | 30 days supply | Qty: 30 | Fill #1

## 2019-12-07 MED FILL — SPIRONOLACTONE 50 MG TABLET: 50 | 30 days supply | Qty: 30 | Fill #1

## 2020-01-11 MED FILL — PHENTERMINE 37.5 MG TABLET: 37.5 | 30 days supply | Qty: 30 | Fill #2

## 2020-01-11 MED FILL — SPIRONOLACTONE 50 MG TABLET: 50 | 30 days supply | Qty: 30 | Fill #2

## 2020-02-17 MED FILL — VALACYCLOVIR HCL 500 MG TAB: 500 | 90 days supply | Qty: 90 | Fill #1

## 2020-02-17 MED FILL — BUPROPION HCL ER (XL) 300 M: 300 | 90 days supply | Qty: 90 | Fill #2

## 2020-02-17 MED FILL — METOPROLOL TARTRATE 25 MG T: 25 | 90 days supply | Qty: 180 | Fill #2

## 2020-02-19 MED FILL — PHENTERMINE 37.5 MG TABLET: 37.5 | 30 days supply | Qty: 30 | Fill #0

## 2020-05-23 MED FILL — METOPROLOL TARTRATE 25 MG T: 25 | 90 days supply | Qty: 180 | Fill #3

## 2020-05-23 MED FILL — BUPROPION HCL ER (XL) 300 M: 300 | 90 days supply | Qty: 90 | Fill #3

## 2020-05-23 MED FILL — VALACYCLOVIR HCL 500 MG TAB: 500 | 90 days supply | Qty: 90 | Fill #2

## 2020-07-19 MED FILL — ADAPALENE 0.3% GEL: 0.3 | 30 days supply | Qty: 45 | Fill #1

## 2020-08-16 MED FILL — IBUPROFEN 800 MG TABS: 800 | 30 days supply | Qty: 90 | Fill #0

## 2020-08-16 MED FILL — PHENTERMINE 37.5 MG TABLET: 37.5 | 30 days supply | Qty: 30 | Fill #0

## 2020-08-20 MED FILL — VALACYCLOVIR HCL 500 MG TAB: 500 | 90 days supply | Qty: 90 | Fill #3

## 2020-08-21 ENCOUNTER — Other Ambulatory Visit (HOSPITAL_COMMUNITY): Payer: Self-pay | Admitting: Family Medicine

## 2020-08-22 MED FILL — METOPROLOL TARTRATE 25 MG T: 25 | 90 days supply | Qty: 180 | Fill #0

## 2020-08-22 MED FILL — BUPROPION HCL ER (XL) 300 M: 300 | 90 days supply | Qty: 90 | Fill #0

## 2020-09-12 ENCOUNTER — Other Ambulatory Visit: Payer: Self-pay | Admitting: Family Medicine

## 2020-09-12 DIAGNOSIS — Z1231 Encounter for screening mammogram for malignant neoplasm of breast: Secondary | ICD-10-CM

## 2020-09-13 MED FILL — PHENTERMINE 37.5 MG TABLET: 37.5 | 30 days supply | Qty: 30 | Fill #1

## 2020-10-07 ENCOUNTER — Ambulatory Visit
Admission: RE | Admit: 2020-10-07 | Discharge: 2020-10-07 | Disposition: A | Discharge status: Home / Self Care | Payer: No Typology Code available for payment source | Source: Ambulatory Visit

## 2020-10-07 ENCOUNTER — Other Ambulatory Visit: Payer: Self-pay

## 2020-10-07 DIAGNOSIS — Z1231 Encounter for screening mammogram for malignant neoplasm of breast: Secondary | ICD-10-CM

## 2020-10-10 ENCOUNTER — Other Ambulatory Visit (HOSPITAL_COMMUNITY): Payer: Self-pay | Admitting: Family Medicine

## 2020-10-10 MED FILL — PHENTERMINE 37.5 MG TABLET: 37.5 | 30 days supply | Qty: 30 | Fill #2

## 2020-11-11 ENCOUNTER — Other Ambulatory Visit (HOSPITAL_COMMUNITY): Payer: Self-pay | Admitting: Family Medicine

## 2020-11-11 MED FILL — PHENTERMINE 37.5 MG TABLET: 37.5 | 30 days supply | Qty: 30 | Fill #0

## 2020-11-21 MED FILL — METOPROLOL TARTRATE 25 MG T: 25 | 90 days supply | Qty: 180 | Fill #1

## 2020-11-21 MED FILL — VALACYCLOVIR HCL 500 MG TAB: 500 | 84 days supply | Qty: 90 | Fill #0

## 2020-11-21 MED FILL — buPROPion HCL ER (XL) 300 M: 300 | 90 days supply | Qty: 90 | Fill #1

## 2020-12-16 MED FILL — PHENTERMINE 37.5 MG TABLET: 37.5 | 30 days supply | Qty: 30 | Fill #1

## 2021-01-17 MED FILL — PHENTERMINE 37.5 MG TABLET: 37.5 | 30 days supply | Qty: 30 | Fill #2

## 2021-02-15 MED FILL — METOPROLOL TARTRATE 25 MG T: 25 | 90 days supply | Qty: 180 | Fill #2

## 2021-02-15 MED FILL — buPROPion HCL ER (XL) 300 M: 300 | 90 days supply | Qty: 90 | Fill #2

## 2021-02-15 MED FILL — VALACYCLOVIR HCL 500 MG TAB: 500 | 84 days supply | Qty: 90 | Fill #1

## 2021-02-16 ENCOUNTER — Other Ambulatory Visit (HOSPITAL_COMMUNITY): Payer: Self-pay | Admitting: Family Medicine

## 2021-02-16 MED FILL — PHENTERMINE 37.5 MG TABLET: 37.5 | 30 days supply | Qty: 30 | Fill #0

## 2021-03-20 MED FILL — PHENTERMINE 37.5 MG TABLET: 37.5 | 30 days supply | Qty: 30 | Fill #1

## 2021-04-17 ENCOUNTER — Other Ambulatory Visit (HOSPITAL_COMMUNITY): Payer: Self-pay

## 2021-04-17 MED FILL — Phentermine HCl Tab 37.5 MG: ORAL | 30 days supply | Qty: 30 | Fill #0 | Status: AC

## 2021-04-18 ENCOUNTER — Other Ambulatory Visit (HOSPITAL_COMMUNITY): Payer: Self-pay

## 2021-05-16 MED FILL — Metoprolol Tartrate Tab 25 MG: ORAL | 90 days supply | Qty: 180 | Fill #0 | Status: AC

## 2021-05-16 MED FILL — Bupropion HCl Tab ER 24HR 300 MG: ORAL | 90 days supply | Qty: 90 | Fill #0 | Status: AC

## 2021-05-16 MED FILL — Valacyclovir HCl Tab 500 MG: ORAL | 84 days supply | Qty: 90 | Fill #0 | Status: AC

## 2021-05-17 ENCOUNTER — Other Ambulatory Visit (HOSPITAL_COMMUNITY): Payer: Self-pay

## 2021-06-12 NOTE — Progress Notes (Signed)
[facility] Note  06/14/2021     CHIEF COMPLAINT Patient presents for Retina Evaluation   HISTORY OF PRESENT ILLNESS: [name] is a 55 y.o. female who presents to the clinic today for:   HPI     Retina Evaluation   In left eye.  This started 2 weeks ago.  I, the attending physician,  performed the HPI with the patient and updated documentation appropriately.        Comments   New patient retinal eval referred by Dr. Valetta Close for BRVO OS. Pt states a few weeks ago she saw Dr. Valetta Close for imaging and in turn was referred to [place]. She hasnt experienced any gradual or long term vision changes but reports recently, since finding out there may be an issues in OS she has experienced FBS. No decrease in vision OS reported. No issues OD.        Last edited by Bernarda Caffey, MD on 06/14/2021  2:10 PM.    Pt is here on the referral of Dr. Valetta Close for concern of BRVO OS, pt went to see him for a routine eye exam, pt states she has no eye hx, she did not start wearing glasses until she was in her 77's, she states she has hypertension, but it is managed by medication, pt states she has been checked by a cardiologist due to her brother having heart problems and she was okay, pt denies being diabetic, no hx of blood clots, pt used to be on birth control, but states she is no longer taking it  Referring physician: Jola Schmidt MD [place], [place] [address]  HISTORICAL INFORMATION:   Selected notes from the MEDICAL RECORD NUMBER Referred by Dr. Jola Schmidt for BRVO OS LEE: 05.31.22 BCVA OD: 20/20 OS: 20/20 Ocular Hx- BRVO,  PMH- HTN    CURRENT MEDICATIONS: No current outpatient medications on file. (Ophthalmic Drugs)   No current facility-administered medications for this visit. (Ophthalmic Drugs)   Current Outpatient Medications (Other)  Medication Sig   aspirin 81 MG tablet Take 81 mg by mouth daily.   buPROPion (WELLBUTRIN XL) 300 MG  24 hr tablet Take 300 mg by mouth daily.   buPROPion (WELLBUTRIN XL) 300 MG 24 hr tablet TAKE 1 TABLET BY MOUTH ONCE A DAY   estrogen, conjugated,-medroxyprogesterone (PREMPRO) 0.625-2.5 MG per tablet Take 1 tablet by mouth daily.   fexofenadine (ALLEGRA) 180 MG tablet Take 180 mg by mouth daily.   fish oil-omega-3 fatty acids 1000 MG capsule Take 2 g by mouth daily.   ibuprofen (ADVIL) 600 MG tablet Take 1 tablet (600 mg total) by mouth every 6 (six) hours as needed.   Melatonin-Pyridoxine (MELATIN PO) Take 1 tablet by mouth at bedtime as needed. For sleep   metoprolol tartrate (LOPRESSOR) 25 MG tablet Take 25 mg by mouth 2 (two) times daily.   metoprolol tartrate (LOPRESSOR) 25 MG tablet TAKE 1 TABLET BY MOUTH TWO TIMES DAILY   phentermine (ADIPEX-P) 37.5 MG tablet TAKE 1/2 TO 1 TABLET BY MOUTH ONCE A DAY AS NEEDED   phentermine (ADIPEX-P) 37.5 MG tablet TAKE 1/2 TO 1 TABLET BY MOUTH ONCE A DAY AS NEEDED   valACYclovir (VALTREX) 500 MG tablet Take 500 mg by mouth 2 (two) times daily as needed.   valACYclovir (VALTREX) 500 MG tablet TAKE 1 TABLET BY MOUTH ONCE A DAY TO TWO TIMES DAILY FOR 3 DAYS AS NEEDED FOR OUTBREAK   No current facility-administered  medications for this visit. (Other)      REVIEW OF SYSTEMS: ROS   Positive for: Cardiovascular, Psychiatric Negative for: Constitutional, Gastrointestinal, Neurological, Skin, Genitourinary, Musculoskeletal, HENT, Endocrine, Eyes, Respiratory, Allergic/Imm, Heme/Lymph Last edited by Doneen Poisson on 06/14/2021  9:21 AM.       ALLERGIES No Known Allergies  PAST MEDICAL HISTORY Past Medical History:  Diagnosis Date   Dizziness 10/21/2015   Essential hypertension 10/21/2015   Hyperlipidemia 10/21/2015   Hypertension    Kidney calculi    Shortness of breath 10/21/2015   History reviewed. No pertinent surgical history.  FAMILY HISTORY Family History  Problem Relation Age of Onset   CAD Brother    Atrial fibrillation Sister     Breast cancer Neg Hx     SOCIAL HISTORY Social History   Tobacco Use   Smoking status: Never   Smokeless tobacco: Never  Substance Use Topics   Alcohol use: Yes   Drug use: No         OPHTHALMIC EXAM:  Base Eye Exam     Visual Acuity (Snellen - Linear)       Right Left   Dist cc 20/20 20/20    Correction: Glasses         Tonometry (Tonopen, 9:18 AM)       Right Left   Pressure 15 16         Pupils       Dark Light Shape React APD   Right 3 2 Round Brisk None   Left 4 3 Round Brisk None         Visual Fields (Counting fingers)       Left Right    Full Full         Extraocular Movement       Right Left    Full, Ortho Full, Ortho         Neuro/Psych     Oriented x3: Yes   Mood/Affect: Normal         Dilation     Both eyes: 1.0% Mydriacyl, 2.5% Phenylephrine @ 9:19 AM           Slit Lamp and Fundus Exam     Slit Lamp Exam       Right Left   Lids/Lashes Dermatochalasis - upper lid Dermatochalasis - upper lid   Conjunctiva/Sclera White and quiet White and quiet   Cornea 1+ fine Punctate epithelial erosions 1+ fine Punctate epithelial erosions   Anterior Chamber Deep and quiet Deep and quiet   Iris Round and dilated Round and dilated   Lens 1-2+ Nuclear sclerosis, 1-2+ Cortical cataract 1-2+ Nuclear sclerosis, 1-2+ Cortical cataract   Vitreous Vitreous syneresis Vitreous syneresis         Fundus Exam       Right Left   Disc Pink and Sharp Pink and Sharp   C/D Ratio 0.5 0.5   Macula Flat, Good foveal reflex, mild RPE mottling, No heme or edema Flat, blunted foveal reflex, cluster of IRH superior macula and along ST arcades, No edema   Vessels mild attenuation, mild tortuousity attenuated, Tortuous, mild Copper wiring, AV crossing changes, mild BRVO along ST arcades   Periphery Attached, very mild reticular degeneration Attached, very mild reticular degeneration           Refraction     Wearing Rx        Sphere Cylinder Axis   Right +1.00 +0.25 180   Left -1.75 +1.25 094  Manifest Refraction       Sphere Cylinder Axis Dist VA   Right +0.75 +0.25 180 20/20   Left -1.75 +1.25 090 20/20            IMAGING AND PROCEDURES  Imaging and Procedures for 06/14/2021  OCT, Retina - OU - Both Eyes       Right Eye Quality was good. Central Foveal Thickness: 278. Progression has no prior data. Findings include normal foveal contour, no IRF, no SRF, retinal drusen , vitreomacular adhesion (Focal druse nasal macula).   Left Eye Quality was good. Central Foveal Thickness: 315. Progression has no prior data. Findings include normal foveal contour, no IRF, no SRF, vitreomacular adhesion .   Notes *Images captured and stored on drive  Diagnosis / Impression:  NFP; no IRF/SRF OU  Clinical management:  See below  Abbreviations: NFP - Normal foveal profile. CME - cystoid macular edema. PED - pigment epithelial detachment. IRF - intraretinal fluid. SRF - subretinal fluid. EZ - ellipsoid zone. ERM - epiretinal membrane. ORA - outer retinal atrophy. ORT - outer retinal tubulation. SRHM - subretinal hyper-reflective material. IRHM - intraretinal hyper-reflective material       Fluorescein Angiography Optos (Transit OS)       Right Eye Progression has no prior data. Early phase findings include normal observations. Mid/Late phase findings include normal observations (No leakage).   Left Eye Progression has no prior data. Early phase findings include leakage. Mid/Late phase findings include leakage (Mild perivascular leakage along inner ST venule).   Notes **Images stored on drive**  Impression: OD: normal study OS: mild BRVO OS -- Mild perivascular leakage along inner ST venule             ASSESSMENT/PLAN:    ICD-10-CM   1. Stable branch retinal vein occlusion of left eye  H34.8322     2. Retinal edema  H35.81 OCT, Retina - OU - Both Eyes    3. Essential  hypertension  I10 Fluorescein Angiography Optos (Transit OS)    4. Hypertensive retinopathy of both eyes  H35.033     5. Combined forms of age-related cataract of both eyes  H25.813       1,2. BRVO w/o CME OS - The natural history of retinal vein occlusion and macular edema and treatment options including observation, laser photocoagulation, and intravitreal antiVEGF injection with Avastin and Lucentis and Eylea and intravitreal injection of steroids with triamcinolone and Ozurdex and the complications of these procedures including loss of vision, infection, cataract, glaucoma, and retinal detachment were discussed with patient. - Specifically discussed findings from [place] / [place] study regarding patient stabilization with anti-VEGF agents and increased potential for visual improvements.  Also discussed need for frequent follow up and potentially multiple injections given the chronic nature of the disease process - BCVA 20/20 - exam shows cluster of IRH along ST venule and superior macular - OCT shows no IRF/CME - FA 6.15.22 shows very mild perivascular leakage along inner ST venule - F/U 4 weeks -- DFE/OCT/possible injection if CME develops and vision decreases  3,4. Hypertensive retinopathy OU - discussed importance of tight BP control - monitor  5. Mixed Cataract OU - The symptoms of cataract, surgical options, and treatments and risks were discussed with patient. - discussed diagnosis and progression - not yet visually significant - monitor for now    Ophthalmic Meds Ordered this visit:  No orders of the defined types were placed in this encounter.      Return  in about 4 weeks (around 07/12/2021) for f/u BRVO OS, DFE, OCT.  There are no Patient Instructions on file for this visit.   Explained the diagnoses, plan, and follow up with the patient and they expressed understanding.  Patient expressed understanding of the importance of proper follow up care.   This document  serves as a record of services personally performed by Gardiner Sleeper, MD, PhD. It was created on their behalf by Roselee Nova, COMT. The creation of this record is the provider's dictation and/or activities during the visit.  Electronically signed by: Roselee Nova, COMT 06/14/21 2:13 PM  Gardiner Sleeper, M.D., Ph.D. Diseases & Surgery of the Retina and Vitreous [facility]  I have reviewed the above documentation for accuracy and completeness, and I agree with the above. Gardiner Sleeper, M.D., Ph.D. 06/14/21 2:20 PM  Abbreviations: M myopia (nearsighted); A astigmatism; H hyperopia (farsighted); P presbyopia; Mrx spectacle prescription;  CTL contact lenses; OD right eye; OS left eye; OU both eyes  XT exotropia; ET esotropia; PEK punctate epithelial keratitis; PEE punctate epithelial erosions; DES dry eye syndrome; MGD meibomian gland dysfunction; ATs artificial tears; PFAT's preservative free artificial tears; [place] nuclear sclerotic cataract; PSC posterior subcapsular cataract; ERM epi-retinal membrane; PVD posterior vitreous detachment; RD retinal detachment; DM diabetes mellitus; DR diabetic retinopathy; NPDR non-proliferative diabetic retinopathy; PDR proliferative diabetic retinopathy; CSME clinically significant macular edema; DME diabetic macular edema; dbh dot blot hemorrhages; CWS cotton wool spot; POAG primary open angle glaucoma; C/D cup-to-disc ratio; HVF humphrey visual field; GVF goldmann visual field; OCT optical coherence tomography; IOP intraocular pressure; BRVO Branch retinal vein occlusion; CRVO central retinal vein occlusion; CRAO central retinal artery occlusion; BRAO branch retinal artery occlusion; RT retinal tear; SB scleral buckle; PPV pars plana vitrectomy; VH Vitreous hemorrhage; PRP panretinal laser photocoagulation; IVK intravitreal kenalog; VMT vitreomacular traction; MH Macular hole;  NVD neovascularization of the disc; NVE neovascularization  elsewhere; AREDS age related eye disease study; ARMD age related macular degeneration; POAG primary open angle glaucoma; EBMD epithelial/anterior basement membrane dystrophy; ACIOL anterior chamber intraocular lens; IOL intraocular lens; PCIOL posterior chamber intraocular lens; Phaco/IOL phacoemulsification with intraocular lens placement; [place] photorefractive keratectomy; LASIK laser assisted in situ keratomileusis; HTN hypertension; DM diabetes mellitus; COPD chronic obstructive pulmonary disease

## 2021-06-13 ENCOUNTER — Other Ambulatory Visit (HOSPITAL_COMMUNITY): Payer: Self-pay

## 2021-06-14 ENCOUNTER — Other Ambulatory Visit (HOSPITAL_COMMUNITY): Payer: Self-pay

## 2021-06-14 ENCOUNTER — Other Ambulatory Visit: Payer: Self-pay

## 2021-06-14 ENCOUNTER — Encounter (INDEPENDENT_AMBULATORY_CARE_PROVIDER_SITE_OTHER): Payer: Self-pay | Admitting: Ophthalmology

## 2021-06-14 ENCOUNTER — Ambulatory Visit (INDEPENDENT_AMBULATORY_CARE_PROVIDER_SITE_OTHER): Payer: No Typology Code available for payment source | Admitting: Ophthalmology

## 2021-06-14 DIAGNOSIS — H3581 Retinal edema: Secondary | ICD-10-CM

## 2021-06-14 DIAGNOSIS — I10 Essential (primary) hypertension: Secondary | ICD-10-CM

## 2021-06-14 DIAGNOSIS — H25813 Combined forms of age-related cataract, bilateral: Secondary | ICD-10-CM

## 2021-06-14 DIAGNOSIS — H35033 Hypertensive retinopathy, bilateral: Secondary | ICD-10-CM | POA: Diagnosis not present

## 2021-06-14 DIAGNOSIS — H348322 Tributary (branch) retinal vein occlusion, left eye, stable: Secondary | ICD-10-CM | POA: Diagnosis not present

## 2021-06-15 ENCOUNTER — Other Ambulatory Visit (HOSPITAL_COMMUNITY): Payer: Self-pay

## 2021-06-15 MED ORDER — PHENTERMINE HCL 37.5 MG PO TABS
18.7500 mg | ORAL_TABLET | Freq: Every day | ORAL | 1 refills | Status: DC | PRN
  Filled 2021-06-15: qty 30, 30d supply, fill #0
  Filled 2021-08-06: qty 30, 30d supply, fill #1

## 2021-06-30 ENCOUNTER — Ambulatory Visit: Payer: No Typology Code available for payment source

## 2021-07-04 ENCOUNTER — Other Ambulatory Visit (HOSPITAL_COMMUNITY): Payer: Self-pay

## 2021-07-04 MED ORDER — NIRMATRELVIR & RITONAVIR 10 X 150 MG & 10 X 100MG PO TBPK
ORAL_TABLET | ORAL | 0 refills | Status: DC
  Filled 2021-07-04: qty 20, 5d supply, fill #0

## 2021-07-10 NOTE — Progress Notes (Signed)
[facility] Note  07/12/2021     CHIEF COMPLAINT Patient presents for Retina Follow Up   HISTORY OF PRESENT ILLNESS: [name] is a 55 y.o. female who presents to the clinic today for:   HPI     Retina Follow Up   Patient presents with  CRVO/BRVO.  In left eye.  This started 4 weeks ago.  I, the attending physician,  performed the HPI with the patient and updated documentation appropriately.        Comments   Patient here for 4 weeks retina follow up for CRVO OS. Patient states vision doing the same. Fine. No eye pain. Has a little headache from covid 2 weeks ago.      Last edited by Bernarda Caffey, MD on 07/15/2021  1:06 AM.    Pt has covid since last visit.  Took Paxlovid.  Referring physician: Jola Schmidt MD [place], [place] [address]  HISTORICAL INFORMATION:   Selected notes from the MEDICAL RECORD NUMBER Referred by Dr. Jola Schmidt for BRVO OS LEE: 05.31.22 BCVA OD: 20/20 OS: 20/20 Ocular Hx- BRVO,  PMH- HTN   CURRENT MEDICATIONS: No current outpatient medications on file. (Ophthalmic Drugs)   No current facility-administered medications for this visit. (Ophthalmic Drugs)   Current Outpatient Medications (Other)  Medication Sig   aspirin 81 MG tablet Take 81 mg by mouth daily.   buPROPion (WELLBUTRIN XL) 300 MG 24 hr tablet Take 300 mg by mouth daily.   buPROPion (WELLBUTRIN XL) 300 MG 24 hr tablet TAKE 1 TABLET BY MOUTH ONCE A DAY   estrogen, conjugated,-medroxyprogesterone (PREMPRO) 0.625-2.5 MG per tablet Take 1 tablet by mouth daily.   fexofenadine (ALLEGRA) 180 MG tablet Take 180 mg by mouth daily.   fish oil-omega-3 fatty acids 1000 MG capsule Take 2 g by mouth daily.   ibuprofen (ADVIL) 600 MG tablet Take 1 tablet (600 mg total) by mouth every 6 (six) hours as needed.   Melatonin-Pyridoxine (MELATIN PO) Take 1 tablet by mouth at bedtime as needed. For sleep   metoprolol tartrate (LOPRESSOR) 25  MG tablet Take 25 mg by mouth 2 (two) times daily.   metoprolol tartrate (LOPRESSOR) 25 MG tablet TAKE 1 TABLET BY MOUTH TWO TIMES DAILY   nirmatrelvir/ritonavir EUA, renal dosing, (PAXLOVID) TBPK Take 1 of each tablet by mouth 2 times daily for 5 days   phentermine (ADIPEX-P) 37.5 MG tablet TAKE 1/2 TO 1 TABLET BY MOUTH ONCE A DAY AS NEEDED   phentermine (ADIPEX-P) 37.5 MG tablet TAKE 1/2 TO 1 TABLET BY MOUTH ONCE A DAY AS NEEDED   phentermine (ADIPEX-P) 37.5 MG tablet Take 0.5-1 tablet (18.75-37.5 mg total) by mouth daily as needed for 2 more months. Not for permanent use.   valACYclovir (VALTREX) 500 MG tablet Take 500 mg by mouth 2 (two) times daily as needed.   valACYclovir (VALTREX) 500 MG tablet TAKE 1 TABLET BY MOUTH ONCE A DAY TO TWO TIMES DAILY FOR 3 DAYS AS NEEDED FOR OUTBREAK   No current facility-administered medications for this visit. (Other)      REVIEW OF SYSTEMS: ROS   Positive for: Cardiovascular, Psychiatric Negative for: Constitutional, Gastrointestinal, Neurological, Skin, Genitourinary, Musculoskeletal, HENT, Endocrine, Eyes, Respiratory, Allergic/Imm, Heme/Lymph Last edited by Theodore Demark, COA on 07/12/2021  9:46 AM.        ALLERGIES No Known Allergies  PAST MEDICAL HISTORY Past Medical History:  Diagnosis Date   Dizziness 10/21/2015  Essential hypertension 10/21/2015   Hyperlipidemia 10/21/2015   Hypertension    Kidney calculi    Shortness of breath 10/21/2015   History reviewed. No pertinent surgical history.  FAMILY HISTORY Family History  Problem Relation Age of Onset   CAD Brother    Atrial fibrillation Sister    Breast cancer Neg Hx     SOCIAL HISTORY Social History   Tobacco Use   Smoking status: Never   Smokeless tobacco: Never  Substance Use Topics   Alcohol use: Yes   Drug use: No         OPHTHALMIC EXAM:  Base Eye Exam     Visual Acuity (Snellen - Linear)       Right Left   Dist cc 20/20 -1 20/20     Correction: Glasses         Tonometry (Tonopen, 9:43 AM)       Right Left   Pressure 18 16         Pupils       Dark Light Shape React APD   Right 3 2 Round Brisk None   Left 3 2 Round Brisk None         Visual Fields (Counting fingers)       Left Right    Full Full         Extraocular Movement       Right Left    Full, Ortho Full, Ortho         Neuro/Psych     Oriented x3: Yes   Mood/Affect: Normal         Dilation     Both eyes: 1.0% Mydriacyl, 2.5% Phenylephrine @ 9:43 AM           Slit Lamp and Fundus Exam     Slit Lamp Exam       Right Left   Lids/Lashes Dermatochalasis - upper lid Dermatochalasis - upper lid   Conjunctiva/Sclera White and quiet White and quiet   Cornea 1+ fine Punctate epithelial erosions 1+ fine Punctate epithelial erosions   Anterior Chamber Deep and quiet Deep and quiet   Iris Round and dilated Round and dilated   Lens 1-2+ Nuclear sclerosis, 1-2+ Cortical cataract 1-2+ Nuclear sclerosis, 1-2+ Cortical cataract   Vitreous Vitreous syneresis Vitreous syneresis         Fundus Exam       Right Left   Disc Pink and Sharp Pink and Sharp   C/D Ratio 0.5 0.5   Macula Flat, Good foveal reflex, mild RPE mottling, No heme or edema Flat, blunted foveal reflex, cluster of IRH superior macula and along ST arcades--improved; almost completely resolved, no edema   Vessels mild attenuation, mild tortuousity attenuated, Tortuous, mild Copper wiring, AV crossing changes, mild BRVO along ST arcades   Periphery Attached, very mild reticular degeneration Attached, very mild reticular degeneration.  No heme.           Refraction     Wearing Rx       Sphere Cylinder Axis   Right +1.00 +0.25 180   Left -1.75 +1.25 094            IMAGING AND PROCEDURES  Imaging and Procedures for 07/12/2021  OCT, Retina - OU - Both Eyes       Right Eye Quality was good. Central Foveal Thickness: 257. Progression has been stable.  Findings include normal foveal contour, no IRF, no SRF, retinal drusen , vitreomacular adhesion (Focal drusen nasal macula).  Left Eye Quality was good. Central Foveal Thickness: 259. Progression has improved. Findings include normal foveal contour, no IRF, no SRF, vitreomacular adhesion , retinal drusen (Interval improvement in [facility] along ST arcades en face image).   Notes *Images captured and stored on drive  Diagnosis / Impression:  NFP; no IRF/SRF OU OS: Interval improvement in [facility] along ST arcades on en face image  Clinical management:  See below  Abbreviations: NFP - Normal foveal profile. CME - cystoid macular edema. PED - pigment epithelial detachment. IRF - intraretinal fluid. SRF - subretinal fluid. EZ - ellipsoid zone. ERM - epiretinal membrane. ORA - outer retinal atrophy. ORT - outer retinal tubulation. SRHM - subretinal hyper-reflective material. IRHM - intraretinal hyper-reflective material                ASSESSMENT/PLAN:    ICD-10-CM   1. Stable branch retinal vein occlusion of left eye  L57.2620     2. Retinal edema  H35.81 OCT, Retina - OU - Both Eyes    3. Essential hypertension  I10     4. Hypertensive retinopathy of both eyes  H35.033     5. Combined forms of age-related cataract of both eyes  H25.813        1,2. BRVO w/o CME OS - mild and improving - BCVA 20/20 - exam shows cluster of IRH along ST venule and superior macula improving/fading - OCT shows interval improvement in [facility] along ST arcades on en face image - FA 6.15.22 shows very mild perivascular leakage along inner ST venule - no ophthalmic intervention indicated or recommended -- monitor - F/U 3-4 mos -- DFE/OCT  3,4. Hypertensive retinopathy OU - discussed importance of tight BP control - monitor  5. Mixed Cataract OU - The symptoms of cataract, surgical options, and treatments and risks were discussed with patient. - discussed diagnosis and progression - not yet visually  significant - monitor for now    Ophthalmic Meds Ordered this visit:  No orders of the defined types were placed in this encounter.      Return for 3-4 mo f/u for BRVO w/o CME OS w/DFE&OCT.  There are no Patient Instructions on file for this visit.   Explained the diagnoses, plan, and follow up with the patient and they expressed understanding.  Patient expressed understanding of the importance of proper follow up care.   This document serves as a record of services personally performed by Gardiner Sleeper, MD, PhD. It was created on their behalf by Roselee Nova, COMT. The creation of this record is the provider's dictation and/or activities during the visit.  Electronically signed by: Roselee Nova, COMT 07/15/21 1:06 AM  This document serves as a record of services personally performed by Gardiner Sleeper, MD, PhD. It was created on their behalf by Estill Bakes, COT an ophthalmic technician. The creation of this record is the provider's dictation and/or activities during the visit.    Electronically signed by: Estill Bakes, COT 7.13.22 @ 1:06 AM   Gardiner Sleeper, M.D., Ph.D. Diseases & Surgery of the Retina and [place] 7.13.22  I have reviewed the above documentation for accuracy and completeness, and I agree with the above. Gardiner Sleeper, M.D., Ph.D. 07/15/21 1:08 AM  Abbreviations: M myopia (nearsighted); A astigmatism; H hyperopia (farsighted); P presbyopia; Mrx spectacle prescription;  CTL contact lenses; OD right eye; OS left eye; OU both eyes  XT exotropia; ET esotropia; PEK punctate epithelial keratitis; PEE  punctate epithelial erosions; DES dry eye syndrome; MGD meibomian gland dysfunction; ATs artificial tears; PFAT's preservative free artificial tears; [place] nuclear sclerotic cataract; PSC posterior subcapsular cataract; ERM epi-retinal membrane; PVD posterior vitreous detachment; RD retinal detachment; DM diabetes mellitus; DR  diabetic retinopathy; NPDR non-proliferative diabetic retinopathy; PDR proliferative diabetic retinopathy; CSME clinically significant macular edema; DME diabetic macular edema; dbh dot blot hemorrhages; CWS cotton wool spot; POAG primary open angle glaucoma; C/D cup-to-disc ratio; HVF humphrey visual field; GVF goldmann visual field; OCT optical coherence tomography; IOP intraocular pressure; BRVO Branch retinal vein occlusion; CRVO central retinal vein occlusion; CRAO central retinal artery occlusion; BRAO branch retinal artery occlusion; RT retinal tear; SB scleral buckle; PPV pars plana vitrectomy; VH Vitreous hemorrhage; PRP panretinal laser photocoagulation; IVK intravitreal kenalog; VMT vitreomacular traction; MH Macular hole;  NVD neovascularization of the disc; NVE neovascularization elsewhere; AREDS age related eye disease study; ARMD age related macular degeneration; POAG primary open angle glaucoma; EBMD epithelial/anterior basement membrane dystrophy; ACIOL anterior chamber intraocular lens; IOL intraocular lens; PCIOL posterior chamber intraocular lens; Phaco/IOL phacoemulsification with intraocular lens placement; [place] photorefractive keratectomy; LASIK laser assisted in situ keratomileusis; HTN hypertension; DM diabetes mellitus; COPD chronic obstructive pulmonary disease

## 2021-07-12 ENCOUNTER — Ambulatory Visit (INDEPENDENT_AMBULATORY_CARE_PROVIDER_SITE_OTHER): Payer: No Typology Code available for payment source | Admitting: Ophthalmology

## 2021-07-12 ENCOUNTER — Encounter (INDEPENDENT_AMBULATORY_CARE_PROVIDER_SITE_OTHER): Payer: Self-pay | Admitting: Ophthalmology

## 2021-07-12 ENCOUNTER — Other Ambulatory Visit: Payer: Self-pay

## 2021-07-12 DIAGNOSIS — H3581 Retinal edema: Secondary | ICD-10-CM | POA: Diagnosis not present

## 2021-07-12 DIAGNOSIS — H25813 Combined forms of age-related cataract, bilateral: Secondary | ICD-10-CM

## 2021-07-12 DIAGNOSIS — H348322 Tributary (branch) retinal vein occlusion, left eye, stable: Secondary | ICD-10-CM | POA: Diagnosis not present

## 2021-07-12 DIAGNOSIS — I10 Essential (primary) hypertension: Secondary | ICD-10-CM

## 2021-07-12 DIAGNOSIS — H35033 Hypertensive retinopathy, bilateral: Secondary | ICD-10-CM | POA: Diagnosis not present

## 2021-07-15 ENCOUNTER — Encounter (INDEPENDENT_AMBULATORY_CARE_PROVIDER_SITE_OTHER): Payer: Self-pay | Admitting: Ophthalmology

## 2021-08-07 ENCOUNTER — Other Ambulatory Visit (HOSPITAL_COMMUNITY): Payer: Self-pay

## 2021-08-17 ENCOUNTER — Other Ambulatory Visit (HOSPITAL_COMMUNITY): Payer: Self-pay

## 2021-08-17 MED FILL — Valacyclovir HCl Tab 500 MG: ORAL | 84 days supply | Qty: 90 | Fill #1 | Status: CN

## 2021-08-18 ENCOUNTER — Other Ambulatory Visit (HOSPITAL_COMMUNITY): Payer: Self-pay

## 2021-08-18 MED ORDER — METOPROLOL TARTRATE 25 MG PO TABS
25.0000 mg | ORAL_TABLET | Freq: Two times a day (BID) | ORAL | 3 refills | Status: DC
  Filled 2021-08-18 – 2021-08-19 (×2): qty 180, 90d supply, fill #0
  Filled 2021-11-06: qty 180, 90d supply, fill #1
  Filled 2022-02-06: qty 180, 90d supply, fill #2
  Filled 2022-05-08: qty 180, 90d supply, fill #3

## 2021-08-18 MED ORDER — BUPROPION HCL ER (XL) 300 MG PO TB24
300.0000 mg | ORAL_TABLET | ORAL | 3 refills | Status: DC
  Filled 2021-08-18 – 2021-08-19 (×2): qty 90, 90d supply, fill #0
  Filled 2022-03-08: qty 90, 90d supply, fill #1

## 2021-08-19 ENCOUNTER — Other Ambulatory Visit (HOSPITAL_COMMUNITY): Payer: Self-pay

## 2021-08-19 MED FILL — Valacyclovir HCl Tab 500 MG: ORAL | 84 days supply | Qty: 90 | Fill #0 | Status: AC

## 2021-08-30 ENCOUNTER — Other Ambulatory Visit (HOSPITAL_COMMUNITY): Payer: Self-pay

## 2021-08-30 MED ORDER — QSYMIA 3.75-23 MG PO CP24
1.0000 | ORAL_CAPSULE | Freq: Every day | ORAL | 0 refills | Status: DC
  Filled 2021-08-30 – 2021-09-07 (×4): qty 30, 30d supply, fill #0

## 2021-08-30 MED ORDER — QSYMIA 7.5-46 MG PO CP24: 1.0000 | ORAL_CAPSULE | Freq: Every day | ORAL | 0 refills | Status: DC

## 2021-09-05 ENCOUNTER — Other Ambulatory Visit (HOSPITAL_COMMUNITY): Payer: Self-pay

## 2021-09-06 ENCOUNTER — Other Ambulatory Visit (HOSPITAL_COMMUNITY): Payer: Self-pay

## 2021-09-07 ENCOUNTER — Other Ambulatory Visit (HOSPITAL_COMMUNITY): Payer: Self-pay

## 2021-09-08 ENCOUNTER — Other Ambulatory Visit: Payer: Self-pay | Admitting: Family Medicine

## 2021-09-08 DIAGNOSIS — Z1231 Encounter for screening mammogram for malignant neoplasm of breast: Secondary | ICD-10-CM

## 2021-09-28 ENCOUNTER — Other Ambulatory Visit (HOSPITAL_COMMUNITY): Payer: Self-pay

## 2021-09-28 MED ORDER — CLOBETASOL PROPIONATE 0.05 % EX OINT
TOPICAL_OINTMENT | CUTANEOUS | 2 refills | Status: DC
  Filled 2021-09-28: qty 60, 30d supply, fill #0

## 2021-09-28 MED ORDER — MUPIROCIN 2 % EX OINT
TOPICAL_OINTMENT | CUTANEOUS | 2 refills | Status: DC
  Filled 2021-09-28: qty 22, 30d supply, fill #0

## 2021-09-29 ENCOUNTER — Other Ambulatory Visit (HOSPITAL_COMMUNITY): Payer: Self-pay

## 2021-10-07 ENCOUNTER — Other Ambulatory Visit (HOSPITAL_COMMUNITY): Payer: Self-pay

## 2021-10-07 MED ORDER — PEG 3350-KCL-NA BICARB-NACL 420 G PO SOLR
ORAL | 0 refills | Status: DC
  Filled 2021-10-07: qty 4000, 2d supply, fill #0

## 2021-10-17 ENCOUNTER — Ambulatory Visit: Payer: No Typology Code available for payment source

## 2021-10-18 ENCOUNTER — Encounter (INDEPENDENT_AMBULATORY_CARE_PROVIDER_SITE_OTHER): Payer: No Typology Code available for payment source | Admitting: Ophthalmology

## 2021-10-18 DIAGNOSIS — H35033 Hypertensive retinopathy, bilateral: Secondary | ICD-10-CM

## 2021-10-18 DIAGNOSIS — I10 Essential (primary) hypertension: Secondary | ICD-10-CM

## 2021-10-18 DIAGNOSIS — H348322 Tributary (branch) retinal vein occlusion, left eye, stable: Secondary | ICD-10-CM

## 2021-10-18 DIAGNOSIS — H3581 Retinal edema: Secondary | ICD-10-CM

## 2021-10-18 DIAGNOSIS — H25813 Combined forms of age-related cataract, bilateral: Secondary | ICD-10-CM

## 2021-10-27 NOTE — Progress Notes (Shared)
[facility] Note  11/01/2021     CHIEF COMPLAINT Patient presents for No chief complaint on file.   HISTORY OF PRESENT ILLNESS: Kaytlen Lightsey is a 55 y.o. female who presents to the clinic today for:      Referring physician: Jola Schmidt MD [place], [place] [id number]  HISTORICAL INFORMATION:   Selected notes from the MEDICAL RECORD NUMBER Referred by Dr. Jola Schmidt for BRVO OS LEE: 05.31.22 BCVA OD: 20/20 OS: 20/20 Ocular Hx- BRVO,  PMH- HTN   CURRENT MEDICATIONS: No current outpatient medications on file. (Ophthalmic Drugs)   No current facility-administered medications for this visit. (Ophthalmic Drugs)   Current Outpatient Medications (Other)  Medication Sig   aspirin 81 MG tablet Take 81 mg by mouth daily.   buPROPion (WELLBUTRIN XL) 300 MG 24 hr tablet Take 300 mg by mouth daily.   buPROPion (WELLBUTRIN XL) 300 MG 24 hr tablet TAKE 1 TABLET BY MOUTH ONCE A DAY   buPROPion (WELLBUTRIN XL) 300 MG 24 hr tablet Take 1 tablet by mouth once daily   clobetasol ointment (TEMOVATE) 0.05 % Apply once daily for 4-6 weeks at bedtime to fingertips with gloves   estrogen, conjugated,-medroxyprogesterone (PREMPRO) 0.625-2.5 MG per tablet Take 1 tablet by mouth daily.   fexofenadine (ALLEGRA) 180 MG tablet Take 180 mg by mouth daily.   fish oil-omega-3 fatty acids 1000 MG capsule Take 2 g by mouth daily.   ibuprofen (ADVIL) 600 MG tablet Take 1 tablet (600 mg total) by mouth every 6 (six) hours as needed.   Melatonin-Pyridoxine (MELATIN PO) Take 1 tablet by mouth at bedtime as needed. For sleep   metoprolol tartrate (LOPRESSOR) 25 MG tablet Take 1 tablet (25 mg total) by mouth 2 (two) times daily.   mupirocin ointment (BACTROBAN) 2 % Apply to the affected area three times daily x 5 days and then once daily until healed   nirmatrelvir/ritonavir EUA, renal dosing, (PAXLOVID) TBPK Take 1 of each tablet by mouth 2 times daily for 5  days   phentermine (ADIPEX-P) 37.5 MG tablet TAKE 1/2 TO 1 TABLET BY MOUTH ONCE A DAY AS NEEDED   phentermine (ADIPEX-P) 37.5 MG tablet TAKE 1/2 TO 1 TABLET BY MOUTH ONCE A DAY AS NEEDED   phentermine (ADIPEX-P) 37.5 MG tablet Take 0.5-1 tablet (18.75-37.5 mg total) by mouth daily as needed for 2 more months. Not for permanent use.   Phentermine-Topiramate (QSYMIA) 3.75-23 MG CP24 Take 1 capsule by mouth daily.   Phentermine-Topiramate (QSYMIA) 7.5-46 MG CP24 Take 1 capsule by mouth daily (after completing lower dose) 14 days   polyethylene glycol-electrolytes (NULYTELY) 420 g solution Take as directed   valACYclovir (VALTREX) 500 MG tablet Take 500 mg by mouth 2 (two) times daily as needed.   valACYclovir (VALTREX) 500 MG tablet TAKE 1 TABLET BY MOUTH ONCE A DAY TO TWO TIMES DAILY FOR 3 DAYS AS NEEDED FOR OUTBREAK   No current facility-administered medications for this visit. (Other)      REVIEW OF SYSTEMS:      ALLERGIES No Known Allergies  PAST MEDICAL HISTORY Past Medical History:  Diagnosis Date   Dizziness 10/21/2015   Essential hypertension 10/21/2015   Hyperlipidemia 10/21/2015   Hypertension    Kidney calculi    Shortness of breath 10/21/2015   No past surgical history on file.  FAMILY HISTORY Family History  Problem Relation Age of Onset   CAD Brother    Atrial fibrillation  Sister    Breast cancer Neg Hx     SOCIAL HISTORY Social History   Tobacco Use   Smoking status: Never   Smokeless tobacco: Never  Substance Use Topics   Alcohol use: Yes   Drug use: No         OPHTHALMIC EXAM:  Not recorded     IMAGING AND PROCEDURES  Imaging and Procedures for 11/01/2021             ASSESSMENT/PLAN:  No diagnosis found.    1,2. 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 Hypertensive retinopathy OU - discussed importance of tight BP control - monitor  5. Mixed Cataract OU - The symptoms of cataract, surgical options, and treatments and risks were discussed with patient. - discussed diagnosis and progression - not yet visually significant - monitor for now    Ophthalmic Meds Ordered this visit:  No orders of the defined types were placed in this encounter.      No follow-ups on file.  There are no Patient Instructions on file for this visit.   Explained the diagnoses, plan, and follow up with the patient and they expressed understanding.  Patient expressed understanding of the importance of proper follow up care.   This document serves as a record of services personally performed by Gardiner Sleeper, MD, PhD. It was created on their behalf by Orvan Falconer, an ophthalmic technician. The creation of this record is the provider's dictation and/or activities during the visit.    Electronically signed by: Orvan Falconer, OA, 10/27/21  11:16 AM     Gardiner Sleeper, M.D., Ph.D. Diseases & Surgery of the Retina and [place] 7.13.22  I have reviewed the above documentation for accuracy and completeness, and I agree with the above. Gardiner Sleeper, M.D., Ph.D. 07/15/21 11:16 AM  Abbreviations: M myopia (nearsighted); A astigmatism; H hyperopia (farsighted); P presbyopia; Mrx spectacle prescription;  CTL contact lenses; OD right eye; OS left eye; OU both eyes  XT exotropia; ET esotropia; PEK punctate epithelial keratitis; PEE punctate epithelial erosions; DES dry eye syndrome; MGD meibomian gland dysfunction; ATs artificial tears; PFAT's preservative free artificial tears; [place] nuclear sclerotic cataract; PSC posterior subcapsular cataract; ERM  epi-retinal membrane; PVD posterior vitreous detachment; RD retinal detachment; DM diabetes mellitus; DR diabetic retinopathy; NPDR non-proliferative diabetic retinopathy; PDR proliferative diabetic retinopathy; CSME clinically significant macular edema; DME diabetic macular edema; dbh dot blot hemorrhages; CWS cotton wool spot; POAG primary open angle glaucoma; C/D cup-to-disc ratio; HVF humphrey visual field; GVF goldmann visual field; OCT optical coherence tomography; IOP intraocular pressure; BRVO Branch retinal vein occlusion; CRVO central retinal vein occlusion; CRAO central retinal artery occlusion; BRAO branch retinal artery occlusion; RT retinal tear; SB scleral buckle; PPV pars plana vitrectomy; VH Vitreous hemorrhage; PRP panretinal laser photocoagulation; IVK intravitreal kenalog; VMT vitreomacular traction; MH Macular hole;  NVD neovascularization of the disc; NVE neovascularization elsewhere; AREDS age related eye disease study; ARMD age related macular degeneration; POAG primary open angle glaucoma; EBMD epithelial/anterior basement membrane dystrophy; ACIOL anterior chamber intraocular lens; IOL intraocular lens; PCIOL posterior chamber intraocular lens; Phaco/IOL phacoemulsification  Phaco/IOL phacoemulsification with intraocular lens placement; [place] photorefractive keratectomy; LASIK laser assisted in situ keratomileusis; HTN hypertension; DM diabetes mellitus; COPD chronic obstructive pulmonary disease

## 2021-11-01 ENCOUNTER — Encounter (INDEPENDENT_AMBULATORY_CARE_PROVIDER_SITE_OTHER): Payer: No Typology Code available for payment source | Admitting: Ophthalmology

## 2021-11-01 DIAGNOSIS — H25813 Combined forms of age-related cataract, bilateral: Secondary | ICD-10-CM

## 2021-11-01 DIAGNOSIS — H35033 Hypertensive retinopathy, bilateral: Secondary | ICD-10-CM

## 2021-11-01 DIAGNOSIS — H348322 Tributary (branch) retinal vein occlusion, left eye, stable: Secondary | ICD-10-CM

## 2021-11-01 DIAGNOSIS — H3581 Retinal edema: Secondary | ICD-10-CM

## 2021-11-01 DIAGNOSIS — I10 Essential (primary) hypertension: Secondary | ICD-10-CM

## 2021-11-03 NOTE — Progress Notes (Signed)
[facility] Note  11/10/2021     CHIEF COMPLAINT Patient presents for Retina Follow Up   HISTORY OF PRESENT ILLNESS: [name] is a 55 y.o. female who presents to the clinic today for:   HPI     Retina Follow Up   Patient presents with  CRVO/BRVO.  In left eye.  Severity is mild.  Duration of 4 months.  Since onset it is stable.  I, the attending physician,  performed the HPI with the patient and updated documentation appropriately.        Comments   Pt here for 4 mo ret f/u for BRVO OS. Pt states vision is fine, no changes shes noticed. No ocular painr or discomfort.       Last edited by Bernarda Caffey, MD on 11/10/2021 12:57 PM.     Referring physician: Jola Schmidt MD [place], [place] [id number]  HISTORICAL INFORMATION:   Selected notes from the MEDICAL RECORD NUMBER Referred by Dr. Jola Schmidt for BRVO OS LEE: 05.31.22 BCVA OD: 20/20 OS: 20/20 Ocular Hx- BRVO,  PMH- HTN   CURRENT MEDICATIONS: No current outpatient medications on file. (Ophthalmic Drugs)   No current facility-administered medications for this visit. (Ophthalmic Drugs)   Current Outpatient Medications (Other)  Medication Sig   buPROPion (WELLBUTRIN XL) 300 MG 24 hr tablet Take 1 tablet by mouth once daily   cetirizine-pseudoephedrine (ZYRTEC-D) 5-120 MG tablet Take 1 tablet by mouth 2 (two) times daily.   fish oil-omega-3 fatty acids 1000 MG capsule Take 2 g by mouth daily.   Melatonin-Pyridoxine (MELATIN PO) Take 1 tablet by mouth at bedtime as needed. For sleep   metoprolol tartrate (LOPRESSOR) 25 MG tablet Take 1 tablet (25 mg total) by mouth 2 (two) times daily.   valACYclovir (VALTREX) 500 MG tablet TAKE 1 TABLET BY MOUTH ONCE A DAY TO TWO TIMES DAILY FOR 3 DAYS AS NEEDED FOR OUTBREAK   aspirin 81 MG tablet Take 81 mg by mouth daily.   buPROPion (WELLBUTRIN XL) 300 MG 24 hr tablet Take 300 mg by mouth daily.   buPROPion (WELLBUTRIN  XL) 300 MG 24 hr tablet TAKE 1 TABLET BY MOUTH ONCE A DAY   buPROPion (WELLBUTRIN XL) 300 MG 24 hr tablet Take 1 tablet by mouth once a day.   clobetasol ointment (TEMOVATE) 0.05 % Apply once daily for 4-6 weeks at bedtime to fingertips with gloves (Patient not taking: Reported on 11/10/2021)   COVID-19 mRNA bivalent vaccine, Moderna, (MODERNA COVID-19 BIVAL BOOSTER) 50 MCG/0.5ML injection Inject into the muscle.   estrogen, conjugated,-medroxyprogesterone (PREMPRO) 0.625-2.5 MG per tablet Take 1 tablet by mouth daily.   fexofenadine (ALLEGRA) 180 MG tablet Take 180 mg by mouth daily. (Patient not taking: Reported on 11/10/2021)   ibuprofen (ADVIL) 600 MG tablet Take 1 tablet (600 mg total) by mouth every 6 (six) hours as needed.   mupirocin ointment (BACTROBAN) 2 % Apply to the affected area three times daily x 5 days and then once daily until healed (Patient not taking: Reported on 11/10/2021)   nirmatrelvir/ritonavir EUA, renal dosing, (PAXLOVID) TBPK Take 1 of each tablet by mouth 2 times daily for 5 days (Patient not taking: Reported on 11/10/2021)   phentermine (ADIPEX-P) 37.5 MG tablet TAKE 1/2 TO 1 TABLET BY MOUTH ONCE A DAY AS NEEDED   phentermine (ADIPEX-P) 37.5 MG tablet TAKE 1/2 TO 1 TABLET BY MOUTH ONCE A DAY AS NEEDED   phentermine (ADIPEX-P) 37.5  MG tablet Take 0.5-1 tablet (18.75-37.5 mg total) by mouth daily as needed for 2 more months. Not for permanent use. (Patient not taking: Reported on 11/10/2021)   Phentermine-Topiramate (QSYMIA) 3.75-23 MG CP24 Take 1 capsule by mouth daily. (Patient not taking: Reported on 11/10/2021)   Phentermine-Topiramate (QSYMIA) 7.5-46 MG CP24 Take 1 capsule by mouth daily (after completing lower dose) 14 days   polyethylene glycol-electrolytes (NULYTELY) 420 g solution Take as directed   valACYclovir (VALTREX) 500 MG tablet Take 500 mg by mouth 2 (two) times daily as needed.   valACYclovir (VALTREX) 500 MG tablet Take 1 tablet by mouth twice a day for  3 days as needed.   valACYclovir (VALTREX) 500 MG tablet TAKE 1 TABLET BY MOUTH ONCE OR TWICE A DAY FOR 3 DAYS AS NEEDED FOR OUTBREAK   No current facility-administered medications for this visit. (Other)   REVIEW OF SYSTEMS: ROS   Positive for: Cardiovascular, Psychiatric Negative for: Constitutional, Gastrointestinal, Neurological, Skin, Genitourinary, Musculoskeletal, HENT, Endocrine, Eyes, Respiratory, Allergic/Imm, Heme/Lymph Last edited by Kingsley Spittle, COT on 11/10/2021  9:30 AM.     ALLERGIES No Known Allergies  PAST MEDICAL HISTORY Past Medical History:  Diagnosis Date   Dizziness 10/21/2015   Essential hypertension 10/21/2015   Hyperlipidemia 10/21/2015   Hypertension    Kidney calculi    Shortness of breath 10/21/2015   History reviewed. No pertinent surgical history.  FAMILY HISTORY Family History  Problem Relation Age of Onset   CAD Brother    Atrial fibrillation Sister    Breast cancer Neg Hx     SOCIAL HISTORY Social History   Tobacco Use   Smoking status: Never   Smokeless tobacco: Never  Substance Use Topics   Alcohol use: Yes   Drug use: No       OPHTHALMIC EXAM: Base Eye Exam     Visual Acuity (Snellen - Linear)       Right Left   Dist cc 20/20 20/20    Correction: Glasses         Tonometry (Tonopen, 9:36 AM)       Right Left   Pressure 6 9         Pupils       Dark Light Shape React APD   Right 3 2 Round Brisk None   Left 4 3 Round Brisk None         Visual Fields (Counting fingers)       Left Right    Full Full         Extraocular Movement       Right Left    Full, Ortho Full, Ortho         Neuro/Psych     Oriented x3: Yes   Mood/Affect: Normal         Dilation     Both eyes: 1.0% Mydriacyl, 2.5% Phenylephrine @ 9:37 AM           Slit Lamp and Fundus Exam     Slit Lamp Exam       Right Left   Lids/Lashes Dermatochalasis - upper lid Dermatochalasis - upper lid    Conjunctiva/Sclera White and quiet White and quiet   Cornea 1+ fine Punctate epithelial erosions 1+ fine Punctate epithelial erosions   Anterior Chamber Deep and quiet Deep and quiet   Iris Round and dilated Round and dilated   Lens 1-2+ Nuclear sclerosis, 1-2+ Cortical cataract 1-2+ Nuclear sclerosis, 1-2+ Cortical cataract   Vitreous Vitreous syneresis  Vitreous syneresis         Fundus Exam       Right Left   Disc Pink and Sharp Pink and Sharp   C/D Ratio 0.5 0.5   Macula Flat, Good foveal reflex, mild RPE mottling, No heme or edema Flat, blunted foveal reflex, cluster of IRH superior macula and along ST arcades--  resolved, no edema   Vessels mild attenuation, mild tortuousity attenuated, Tortuous, mild Copper wiring, AV crossing changes, mild BRVO along ST arcades--improved   Periphery Attached, very mild reticular degeneration Attached, very mild reticular degeneration.  No heme.           Refraction     Wearing Rx       Sphere Cylinder Axis   Right +1.00 +0.25 180   Left -1.75 +1.25 094            IMAGING AND PROCEDURES  Imaging and Procedures for 11/10/2021  OCT, Retina - OU - Both Eyes       Right Eye Quality was good. Central Foveal Thickness: 258. Progression has been stable. Findings include normal foveal contour, no IRF, no SRF, retinal drusen , vitreomacular adhesion (Focal drusen nasal macula).   Left Eye Quality was good. Central Foveal Thickness: 253. Progression has been stable. Findings include normal foveal contour, no IRF, no SRF, vitreomacular adhesion , retinal drusen (Stable improvement in [facility] along ST arcades en face image).   Notes *Images captured and stored on drive  Diagnosis / Impression:  NFP; no IRF/SRF OU OS: Stable improvement in [facility] along ST arcades on en face image  Clinical management:  See below  Abbreviations: NFP - Normal foveal profile. CME - cystoid macular edema. PED - pigment epithelial detachment. IRF -  intraretinal fluid. SRF - subretinal fluid. EZ - ellipsoid zone. ERM - epiretinal membrane. ORA - outer retinal atrophy. ORT - outer retinal tubulation. SRHM - subretinal hyper-reflective material. IRHM - intraretinal hyper-reflective material             ASSESSMENT/PLAN:    ICD-10-CM   1. Stable branch retinal vein occlusion of left eye  E26.8341     2. Retinal edema  H35.81 OCT, Retina - OU - Both Eyes    3. Essential hypertension  I10     4. Hypertensive retinopathy of both eyes  H35.033     5. Combined forms of age-related cataract of both eyes  H25.813      1,2. BRVO w/o CME OS - very focal and mild initially and now stably resolved - BCVA 20/20 - exam shows cluster of IRH along ST venule and superior macula resolved - OCT shows stable improvement in [facility] and IRH along ST arcades - FA 6.15.22 shows very mild perivascular leakage along inner ST venule - review of systems negative for any significant cardiovascular risk factors that need to be addressed - pt has history of HTN--well controlled, no DM - no ophthalmic intervention indicated or recommended -- monitor - pt is cleared from a retina standpoint for release to Dr. Valetta Close and resumption of primary eye care   3,4. Hypertensive retinopathy OU - discussed importance of tight BP control - monitor   5. Mixed Cataract OU - The symptoms of cataract, surgical options, and treatments and risks were discussed with patient. - discussed diagnosis and progression - not yet visually significant - monitor for now  Ophthalmic Meds Ordered this visit:  No orders of the defined types were placed in this encounter.  Return if symptoms worsen or fail to improve.  There are no Patient Instructions on file for this visit.  Explained the diagnoses, plan, and follow up with the patient and they expressed understanding.  Patient expressed understanding of the importance of proper follow up care.   This document serves as a  record of services personally performed by Gardiner Sleeper, MD, PhD. It was created on their behalf by Leonie Douglas, an ophthalmic technician. The creation of this record is the provider's dictation and/or activities during the visit.    Electronically signed by: Leonie Douglas COA, 11/10/21  1:01 PM  This document serves as a record of services personally performed by Gardiner Sleeper, MD, PhD. It was created on their behalf by Estill Bakes, COT an ophthalmic technician. The creation of this record is the provider's dictation and/or activities during the visit.    Electronically signed by: Estill Bakes, COT 11.11.22 @ 1:01 PM   Gardiner Sleeper, M.D., Ph.D. Diseases & Surgery of the Retina and [place] 11/10/2021  I have reviewed the above documentation for accuracy and completeness, and I agree with the above. Gardiner Sleeper, M.D., Ph.D. 11/10/21 1:01 PM   Abbreviations: M myopia (nearsighted); A astigmatism; H hyperopia (farsighted); P presbyopia; Mrx spectacle prescription;  CTL contact lenses; OD right eye; OS left eye; OU both eyes  XT exotropia; ET esotropia; PEK punctate epithelial keratitis; PEE punctate epithelial erosions; DES dry eye syndrome; MGD meibomian gland dysfunction; ATs artificial tears; PFAT's preservative free artificial tears; [place] nuclear sclerotic cataract; PSC posterior subcapsular cataract; ERM epi-retinal membrane; PVD posterior vitreous detachment; RD retinal detachment; DM diabetes mellitus; DR diabetic retinopathy; NPDR non-proliferative diabetic retinopathy; PDR proliferative diabetic retinopathy; CSME clinically significant macular edema; DME diabetic macular edema; dbh dot blot hemorrhages; CWS cotton wool spot; POAG primary open angle glaucoma; C/D cup-to-disc ratio; HVF humphrey visual field; GVF goldmann visual field; OCT optical coherence tomography; IOP intraocular pressure; BRVO Branch retinal vein occlusion; CRVO central  retinal vein occlusion; CRAO central retinal artery occlusion; BRAO branch retinal artery occlusion; RT retinal tear; SB scleral buckle; PPV pars plana vitrectomy; VH Vitreous hemorrhage; PRP panretinal laser photocoagulation; IVK intravitreal kenalog; VMT vitreomacular traction; MH Macular hole;  NVD neovascularization of the disc; NVE neovascularization elsewhere; AREDS age related eye disease study; ARMD age related macular degeneration; POAG primary open angle glaucoma; EBMD epithelial/anterior basement membrane dystrophy; ACIOL anterior chamber intraocular lens; IOL intraocular lens; PCIOL posterior chamber intraocular lens; Phaco/IOL phacoemulsification with intraocular lens placement; [place] photorefractive keratectomy; LASIK laser assisted in situ keratomileusis; HTN hypertension; DM diabetes mellitus; COPD chronic obstructive pulmonary disease

## 2021-11-06 ENCOUNTER — Other Ambulatory Visit (HOSPITAL_COMMUNITY): Payer: Self-pay

## 2021-11-07 ENCOUNTER — Other Ambulatory Visit (HOSPITAL_COMMUNITY): Payer: Self-pay

## 2021-11-07 ENCOUNTER — Ambulatory Visit: Payer: No Typology Code available for payment source | Attending: Internal Medicine

## 2021-11-07 ENCOUNTER — Other Ambulatory Visit (HOSPITAL_BASED_OUTPATIENT_CLINIC_OR_DEPARTMENT_OTHER): Payer: Self-pay

## 2021-11-07 DIAGNOSIS — Z1231 Encounter for screening mammogram for malignant neoplasm of breast: Secondary | ICD-10-CM

## 2021-11-07 DIAGNOSIS — Z23 Encounter for immunization: Secondary | ICD-10-CM

## 2021-11-07 MED ORDER — MODERNA COVID-19 BIVAL BOOSTER 50 MCG/0.5ML IM SUSP
INTRAMUSCULAR | 0 refills | Status: DC
  Filled 2021-11-07: qty 0.5, 1d supply, fill #0

## 2021-11-07 NOTE — Progress Notes (Signed)
   Covid-19 Vaccination Clinic  Name:  [name]    MRN: [id number] DOB: 07-Nov-1966  11/07/2021  [name] was observed post Covid-19 immunization for 15 minutes without incident. She was provided with Vaccine Information Sheet and instruction to access the V-Safe system.   [name] was instructed to call 911 with any severe reactions post vaccine: Difficulty breathing  Swelling of face and throat  A fast heartbeat  A bad rash all over body  Dizziness and weakness   Immunizations Administered     Name Date Dose VIS Date Route   Moderna Covid-19 vaccine Bivalent Booster 11/07/2021  9:44 AM 0.5 mL 08/12/2021 Intramuscular   Manufacturer: Moderna   Lot: [id number]   Speed: [id number]

## 2021-11-08 ENCOUNTER — Other Ambulatory Visit (HOSPITAL_COMMUNITY): Payer: Self-pay

## 2021-11-08 MED ORDER — VALACYCLOVIR HCL 500 MG PO TABS
ORAL_TABLET | ORAL | 0 refills | Status: DC
  Filled 2021-11-08: qty 30, 20d supply, fill #0

## 2021-11-08 MED ORDER — BUPROPION HCL ER (XL) 300 MG PO TB24
ORAL_TABLET | ORAL | 0 refills | Status: DC
  Filled 2021-11-08: qty 90, 90d supply, fill #0

## 2021-11-09 ENCOUNTER — Other Ambulatory Visit (HOSPITAL_COMMUNITY): Payer: Self-pay

## 2021-11-10 ENCOUNTER — Other Ambulatory Visit: Payer: Self-pay

## 2021-11-10 ENCOUNTER — Other Ambulatory Visit (HOSPITAL_COMMUNITY): Payer: Self-pay

## 2021-11-10 ENCOUNTER — Ambulatory Visit (INDEPENDENT_AMBULATORY_CARE_PROVIDER_SITE_OTHER): Payer: No Typology Code available for payment source | Admitting: Ophthalmology

## 2021-11-10 ENCOUNTER — Encounter (INDEPENDENT_AMBULATORY_CARE_PROVIDER_SITE_OTHER): Payer: Self-pay | Admitting: Ophthalmology

## 2021-11-10 DIAGNOSIS — H25813 Combined forms of age-related cataract, bilateral: Secondary | ICD-10-CM

## 2021-11-10 DIAGNOSIS — I10 Essential (primary) hypertension: Secondary | ICD-10-CM | POA: Diagnosis not present

## 2021-11-10 DIAGNOSIS — H348322 Tributary (branch) retinal vein occlusion, left eye, stable: Secondary | ICD-10-CM

## 2021-11-10 DIAGNOSIS — H3581 Retinal edema: Secondary | ICD-10-CM

## 2021-11-10 DIAGNOSIS — H35033 Hypertensive retinopathy, bilateral: Secondary | ICD-10-CM

## 2021-11-10 MED ORDER — VALACYCLOVIR HCL 500 MG PO TABS
ORAL_TABLET | ORAL | 3 refills | Status: DC
  Filled 2021-11-10: qty 90, 90d supply, fill #0
  Filled 2022-03-08: qty 90, 45d supply, fill #0

## 2021-11-29 ENCOUNTER — Other Ambulatory Visit: Payer: Self-pay

## 2021-11-29 ENCOUNTER — Ambulatory Visit
Admission: RE | Admit: 2021-11-29 | Discharge: 2021-11-29 | Disposition: A | Discharge status: Home / Self Care | Payer: No Typology Code available for payment source | Source: Ambulatory Visit | Attending: Family Medicine | Admitting: Family Medicine

## 2021-11-29 DIAGNOSIS — Z1231 Encounter for screening mammogram for malignant neoplasm of breast: Secondary | ICD-10-CM

## 2021-12-19 ENCOUNTER — Other Ambulatory Visit (HOSPITAL_COMMUNITY): Payer: Self-pay

## 2021-12-20 ENCOUNTER — Other Ambulatory Visit (HOSPITAL_COMMUNITY): Payer: Self-pay

## 2021-12-21 ENCOUNTER — Other Ambulatory Visit (HOSPITAL_COMMUNITY): Payer: Self-pay

## 2021-12-21 MED ORDER — VALACYCLOVIR HCL 500 MG PO TABS
500.0000 mg | ORAL_TABLET | Freq: Two times a day (BID) | ORAL | 1 refills | Status: DC | PRN
  Filled 2021-12-21: qty 30, 15d supply, fill #0
  Filled 2022-02-06: qty 30, 15d supply, fill #1

## 2022-02-06 ENCOUNTER — Other Ambulatory Visit (HOSPITAL_COMMUNITY): Payer: Self-pay

## 2022-03-08 ENCOUNTER — Other Ambulatory Visit (HOSPITAL_COMMUNITY): Payer: Self-pay

## 2022-03-09 ENCOUNTER — Other Ambulatory Visit (HOSPITAL_COMMUNITY): Payer: Self-pay

## 2022-03-27 ENCOUNTER — Other Ambulatory Visit (HOSPITAL_COMMUNITY): Payer: Self-pay

## 2022-03-27 ENCOUNTER — Other Ambulatory Visit: Payer: Self-pay

## 2022-03-27 ENCOUNTER — Emergency Department (INDEPENDENT_AMBULATORY_CARE_PROVIDER_SITE_OTHER)
Admission: EM | Admit: 2022-03-27 | Discharge: 2022-03-27 | Disposition: A | Discharge status: Home / Self Care | Payer: No Typology Code available for payment source | Source: Home / Self Care | Attending: Family Medicine | Admitting: Family Medicine

## 2022-03-27 DIAGNOSIS — H9201 Otalgia, right ear: Secondary | ICD-10-CM

## 2022-03-27 MED ORDER — FLUTICASONE PROPIONATE 50 MCG/ACT NA SUSP
2.0000 | Freq: Every day | NASAL | 0 refills | Status: AC
Start: 2022-03-27 — End: ?
  Filled 2022-03-27: qty 16, 30d supply, fill #0

## 2022-03-27 MED ORDER — AMOXICILLIN-POT CLAVULANATE 875-125 MG PO TABS
1.0000 | ORAL_TABLET | Freq: Two times a day (BID) | ORAL | 0 refills | Status: DC
  Filled 2022-03-27 (×2): qty 14, 7d supply, fill #0

## 2022-03-27 MED ORDER — CETIRIZINE HCL 10 MG PO TABS
10.0000 mg | ORAL_TABLET | Freq: Every day | ORAL | 0 refills | Status: DC
  Filled 2022-03-27: qty 90, 90d supply, fill #0

## 2022-03-27 NOTE — ED Provider Notes (Signed)
?[facility] ? ? ? ?CSN: [id number] ?Arrival date & time: 03/27/22  1604 ? ? ?  ? ?History   ?Chief Complaint ?Chief Complaint  ?Patient presents with  ? Otalgia  ? Jaw Pain  ? ? ?HPI ?Camerin Jimenez is a 56 y.o. female.  ? ?HPI ? ?Patient states that she had a viral infection a couple weeks ago.  This is gotten better but she has had persistent pressure and pain in her right ear and now has some pain in her right jaw.  She is concerned for infection.  She is here for evaluation.  Generally enjoys good health.  No fever or chills.  No suspicion of COVID ? ?Past Medical History:  ?Diagnosis Date  ? Dizziness 10/21/2015  ? Essential hypertension 10/21/2015  ? Hyperlipidemia 10/21/2015  ? Hypertension   ? Kidney calculi   ? Shortness of breath 10/21/2015  ? ? ?Patient Active Problem List  ? Diagnosis Date Noted  ? Essential hypertension 10/21/2015  ? Hyperlipidemia 10/21/2015  ? Dizziness 10/21/2015  ? Shortness of breath 10/21/2015  ? ? ?History reviewed. No pertinent surgical history. ? ?OB History   ?No obstetric history on file. ?  ? ? ? ?Home Medications   ? ?Prior to Admission medications   ?Medication Sig Start Date End Date Taking? Authorizing Provider  ?amoxicillin-clavulanate (AUGMENTIN) 875-125 MG tablet Take 1 tablet by mouth every 12 (twelve) hours. 03/27/22  Yes Raylene Everts, MD  ?cetirizine (ZYRTEC) 10 MG tablet Take 1 tablet (10 mg total) by mouth daily. 03/27/22  Yes Raylene Everts, MD  ?fluticasone Asencion Islam) 50 MCG/ACT nasal spray Place 2 sprays into both nostrils daily. 03/27/22  Yes Raylene Everts, MD  ?buPROPion (WELLBUTRIN XL) 300 MG 24 hr tablet Take 300 mg by mouth daily.    [provider]  ?cetirizine-pseudoephedrine (ZYRTEC-D) 5-120 MG tablet Take 1 tablet by mouth 2 (two) times daily.    [provider]  ?COVID-19 mRNA bivalent vaccine, Moderna, (MODERNA COVID-19 BIVAL BOOSTER) 50 MCG/0.5ML injection Inject into the muscle. 11/07/21   Carlyle Basques, MD  ?fish oil-omega-3 fatty acids 1000 MG capsule Take 2 g by mouth daily.    [provider]  ?ibuprofen (ADVIL) 600 MG tablet Take 1 tablet (600 mg total) by mouth every 6 (six) hours as needed. 06/16/19   Wieters, Elesa Hacker, PA-C  ?Melatonin-Pyridoxine (MELATIN PO) Take 1 tablet by mouth at bedtime as needed. For sleep    [provider]  ?metoprolol tartrate (LOPRESSOR) 25 MG tablet Take 1 tablet (25 mg total) by mouth 2 (two) times daily. 08/18/21     ?valACYclovir (VALTREX) 500 MG tablet Take 1 tablet by mouth twice a day for 3 days as needed. 11/08/21     ? ? ?Family History ?Family History  ?Problem Relation Age of Onset  ? CAD Brother   ? Atrial fibrillation Sister   ? Breast cancer Neg Hx   ? ? ?Social History ?Social History  ? ?Tobacco Use  ? Smoking status: Never  ? Smokeless tobacco: Never  ?Substance Use Topics  ? Alcohol use: Yes  ? Drug use: No  ? ? ? ?Allergies   ?Patient has no known allergies. ? ? ?Review of Systems ?Review of Systems ?See HPI ? ?Physical Exam ?Triage Vital Signs ?ED Triage Vitals  ?Enc Vitals Group  ?   BP 03/27/22 1617 (!) 142/87  ?   Pulse Rate 03/27/22 1617 62  ?   Resp 03/27/22 1617  14  ?   Temp 03/27/22 1617 (!) 97.5 ?F (36.4 ?C)  ?   Temp Source 03/27/22 1617 Oral  ?   SpO2 03/27/22 1617 96 %  ?   Weight --   ?   Height --   ?   Head Circumference --   ?   Peak Flow --   ?   Pain Score 03/27/22 1618 3  ?   Pain Loc --   ?   Pain Edu? --   ?   Excl. in [place]? --   ? ?No data found. ? ?Updated Vital Signs ?BP (!) 142/87 (BP Location: Left Arm)   Pulse 62   Temp (!) 97.5 ?F (36.4 ?C) (Oral)   Resp 14   SpO2 96% :    ? ?Physical Exam ?Constitutional:   ?   General: She is not in acute distress. ?   Appearance: She is well-developed.  ?HENT:  ?   Head: Normocephalic and atraumatic.  ?   Left Ear: Tympanic membrane, ear canal and external ear normal.  ?   Ears:  ?   Comments: Right TM is bulging, mildly erythematous, dull appearance ?   Nose: No  congestion.  ?   Mouth/Throat:  ?   Mouth: Mucous membranes are moist.  ?   Pharynx: No posterior oropharyngeal erythema.  ?Eyes:  ?   Conjunctiva/sclera: Conjunctivae normal.  ?   Pupils: Pupils are equal, round, and reactive to light.  ?Neck:  ?   Comments: Tender node at the right angle of the jaw. ?Cardiovascular:  ?   Rate and Rhythm: Normal rate.  ?Pulmonary:  ?   Effort: Pulmonary effort is normal. No respiratory distress.  ?Abdominal:  ?   General: There is no distension.  ?   Palpations: Abdomen is soft.  ?Musculoskeletal:     ?   General: Normal range of motion.  ?   Cervical back: Normal range of motion.  ?Lymphadenopathy:  ?   Cervical: Cervical adenopathy present.  ?Skin: ?   General: Skin is warm and dry.  ?Neurological:  ?   Mental Status: She is alert.  ? ? ? ?UC Treatments / Results  ?Labs ?(all labs ordered are listed, but only abnormal results are displayed) ?Labs Reviewed - No data to display ? ?EKG ? ? ?Radiology ?No results found. ? ?Procedures ?Procedures (including critical care time) ? ?Medications Ordered in UC ?Medications - No data to display ? ?Initial Impression / Assessment and Plan / UC Course  ?I have reviewed the triage vital signs and the nursing notes. ? ?Pertinent labs & imaging results that were available during my care of the patient were reviewed by me and considered in my medical decision making (see chart for details). ? ?  ? ?Right otitis media withCervical adenopathy causing pain. ?Final Clinical Impressions(s) / UC Diagnoses  ? ?Final diagnoses:  ?Right ear pain  ? ? ? ?Discharge Instructions   ? ?  ?Continue Zyrtec daily ?Drink lots of fluids ?Start the [facility] daily.  Use twice a day for the first 3 days then once a day until your symptoms have improved.  This will help with the ear drainage ?Take Augmentin 2 times a day for a week ?Consider taking a probiotic while on Augmentin ?Call for problems ? ? ?ED Prescriptions   ? ? Medication Sig Dispense Auth. Provider  ?  amoxicillin-clavulanate (AUGMENTIN) 875-125 MG tablet Take 1 tablet by mouth every 12 (twelve) hours. 14 tablet Meda Coffee,  Jennette Banker, MD  ? fluticasone [facility]) 50 MCG/ACT nasal spray Place 2 sprays into both nostrils daily. 16 g Raylene Everts, MD  ? cetirizine (ZYRTEC) 10 MG tablet Take 1 tablet (10 mg total) by mouth daily. 90 tablet Raylene Everts, MD  ? ?  ? ?PDMP not reviewed this encounter. ?  ?Raylene Everts, MD ?03/27/22 1654 ? ?

## 2022-03-27 NOTE — Discharge Instructions (Signed)
Continue Zyrtec daily ?Drink lots of fluids ?Start the [facility] daily.  Use twice a day for the first 3 days then once a day until your symptoms have improved.  This will help with the ear drainage ?Take Augmentin 2 times a day for a week ?Consider taking a probiotic while on Augmentin ?Call for problems ?

## 2022-03-27 NOTE — ED Triage Notes (Signed)
Pt presents with left ear and jaw pain x 3 days following a virus 1 week ago ?

## 2022-03-28 ENCOUNTER — Telehealth: Payer: Self-pay

## 2022-03-28 NOTE — Telephone Encounter (Signed)
Patient called to request zofran for nausea after taking abx. Says she took on dose after leaving UC without eating. Per Legrand Como, zofran won't really help, advised to take after a full meal with full glass of water. Call back if any questions. ?

## 2022-05-09 ENCOUNTER — Other Ambulatory Visit (HOSPITAL_COMMUNITY): Payer: Self-pay

## 2022-05-17 ENCOUNTER — Encounter (HOSPITAL_BASED_OUTPATIENT_CLINIC_OR_DEPARTMENT_OTHER): Payer: Self-pay | Admitting: Obstetrics & Gynecology

## 2022-05-17 ENCOUNTER — Ambulatory Visit (INDEPENDENT_AMBULATORY_CARE_PROVIDER_SITE_OTHER): Payer: No Typology Code available for payment source | Admitting: Obstetrics & Gynecology

## 2022-05-17 ENCOUNTER — Other Ambulatory Visit (HOSPITAL_BASED_OUTPATIENT_CLINIC_OR_DEPARTMENT_OTHER): Payer: Self-pay

## 2022-05-17 ENCOUNTER — Other Ambulatory Visit (HOSPITAL_COMMUNITY)
Admission: RE | Admit: 2022-05-17 | Discharge: 2022-05-17 | Disposition: A | Discharge status: Home / Self Care | Payer: No Typology Code available for payment source | Source: Ambulatory Visit | Attending: Obstetrics & Gynecology | Admitting: Obstetrics & Gynecology

## 2022-05-17 VITALS — BP 139/83 | HR 64 | Ht 65.0 in | Wt 218.4 lb

## 2022-05-17 DIAGNOSIS — Z124 Encounter for screening for malignant neoplasm of cervix: Secondary | ICD-10-CM | POA: Insufficient documentation

## 2022-05-17 DIAGNOSIS — Z01419 Encounter for gynecological examination (general) (routine) without abnormal findings: Secondary | ICD-10-CM | POA: Diagnosis not present

## 2022-05-17 DIAGNOSIS — K649 Unspecified hemorrhoids: Secondary | ICD-10-CM | POA: Diagnosis not present

## 2022-05-17 DIAGNOSIS — B009 Herpesviral infection, unspecified: Secondary | ICD-10-CM

## 2022-05-17 MED ORDER — MUPIROCIN 2 % EX OINT
1.0000 "application " | TOPICAL_OINTMENT | Freq: Two times a day (BID) | CUTANEOUS | 0 refills | Status: DC
  Filled 2022-05-17: qty 22, 7d supply, fill #0

## 2022-05-17 MED ORDER — VALACYCLOVIR HCL 500 MG PO TABS
ORAL_TABLET | ORAL | 4 refills | Status: DC
  Filled 2022-05-17: qty 90, 45d supply, fill #0
  Filled 2022-08-09: qty 90, 45d supply, fill #1
  Filled 2022-09-20: qty 180, 90d supply, fill #2

## 2022-05-17 NOTE — Progress Notes (Addendum)
56 y.o.  Married White or Caucasian female here for annual exam/new patient.  Has been menopausal since about ager 44.  She did take HRT for a short while to help with hot flashes.  These are very manageable now.  Does have some vaginal dryness and uses lubricant for this.    Is having a lesion in her nose that seems worse in the winter.  Feels that this area won't heal.  Would like some input.  No LMP recorded. (Menstrual status: Perimenopausal).          Sexually active: Yes.    The current method of family planning is post menopausal status.    Smoker:  no  Health Maintenance: Pap:  pt feels like she had one three years ago History of abnormal Pap:  no MMG:  11/29/2021 Negative Colonoscopy:  03/05/2022 at [facility].  Has polyps BMD:   not indicated Screening Labs: will do with PCP   reports that she has quit smoking. Her smoking use included cigarettes. She has never used smokeless tobacco. She reports current alcohol use. She reports that she does not currently use drugs after having used the following drugs: Marijuana.  This was in college many years ago.  Past Medical History:  Diagnosis Date   Dizziness 10/21/2015   Essential hypertension 10/21/2015   HSV (herpes simplex virus) infection    Hyperlipidemia 10/21/2015   Kidney calculi     Past Surgical History:  Procedure Laterality Date   TONSILLECTOMY      Current Outpatient Medications  Medication Sig Dispense Refill   buPROPion (WELLBUTRIN XL) 300 MG 24 hr tablet Take 300 mg by mouth daily.     cetirizine (ZYRTEC) 10 MG tablet Take 1 tablet (10 mg total) by mouth daily. 90 tablet 0   fish oil-omega-3 fatty acids 1000 MG capsule Take 2 g by mouth daily.     fluticasone (FLONASE) 50 MCG/ACT nasal spray Place 2 sprays into both nostrils daily. 16 g 0   Melatonin-Pyridoxine (MELATIN PO) Take 1 tablet by mouth at bedtime as needed. For sleep     metoprolol tartrate (LOPRESSOR) 25 MG tablet Take 1 tablet (25 mg total) by mouth  2 (two) times daily. 180 tablet 3   mupirocin ointment (BACTROBAN) 2 % Apply 1 application. topically 2 (two) times daily. Use for 7 days. 22 g 0   ibuprofen (ADVIL) 600 MG tablet Take 1 tablet (600 mg total) by mouth every 6 (six) hours as needed. 30 tablet 0   valACYclovir (VALTREX) 500 MG tablet Take 1 tablet by mouth twice a day for 3 days as needed. 90 tablet 4   No current facility-administered medications for this visit.    Family History  Problem Relation Age of Onset   CAD Brother    Atrial fibrillation Sister    Breast cancer Neg Hx     Genitourinary:negative  Exam:   BP 139/83 (BP Location: Right Arm, Patient Position: Sitting, Cuff Size: Large)   Pulse 64   Ht '5\' 5"'$  (1.651 m) Comment: reported  Wt 218 lb 6.4 oz (99.1 kg)   BMI 36.34 kg/m   Height: '5\' 5"'$  (165.1 cm) (reported)  General appearance: alert, cooperative and appears stated age Head: Normocephalic, without obvious abnormality, atraumatic Neck: no adenopathy, supple, symmetrical, trachea midline and thyroid normal to inspection and palpation Lungs: clear to auscultation bilaterally Breasts: normal appearance, no masses or tenderness Heart: regular rate and rhythm Abdomen: soft, non-tender; bowel sounds normal; no masses,  no  organomegaly Extremities: extremities normal, atraumatic, no cyanosis or edema Skin: Skin color, texture, turgor normal. No rashes or lesions Lymph nodes: Cervical, supraclavicular, and axillary nodes normal. No abnormal inguinal nodes palpated Neurologic: Grossly normal   Pelvic: External genitalia:  no lesions              Urethra:  normal appearing urethra with no masses, tenderness or lesions              Bartholins and Skenes: normal                 Vagina: normal appearing vagina with normal color and no discharge, no lesions              Cervix: no lesions              Pap taken: Yes.   Bimanual Exam:  Uterus:  normal size, contour, position, consistency, mobility,  non-tender              Adnexa: normal adnexa and no mass, fullness, tenderness               Rectovaginal: Confirms               Anus:  normal sphincter tone, no lesions  Chaperone, Octaviano Batty, CMA, was present for exam.  Assessment/Plan: 1. Well woman exam with routine gynecological exam - pap and HR HPV - MMG up to date - colonoscopy done 2023 - vaccines reviewed/updated - will do lab work with PCP  2. Cervical cancer screening - Cytology - PAP( [place])  3. Hemorrhoids, unspecified hemorrhoid type - Ambulatory referral to Gastroenterology  4. HSV (herpes simplex virus) infection - valACYclovir (VALTREX) 500 MG tablet; Take 1 tablet by mouth twice a day for 3 days as needed.  Dispense: 90 tablet; Refill: 4

## 2022-05-21 ENCOUNTER — Encounter (HOSPITAL_BASED_OUTPATIENT_CLINIC_OR_DEPARTMENT_OTHER): Payer: Self-pay | Admitting: Obstetrics & Gynecology

## 2022-05-21 LAB — CYTOLOGY - PAP
Adequacy: ABSENT
Comment: NEGATIVE
Diagnosis: NEGATIVE
High risk HPV: NEGATIVE

## 2022-05-23 ENCOUNTER — Other Ambulatory Visit (HOSPITAL_BASED_OUTPATIENT_CLINIC_OR_DEPARTMENT_OTHER): Payer: Self-pay

## 2022-06-08 ENCOUNTER — Other Ambulatory Visit (HOSPITAL_COMMUNITY): Payer: Self-pay

## 2022-06-11 ENCOUNTER — Telehealth: Payer: Self-pay

## 2022-06-11 ENCOUNTER — Other Ambulatory Visit (HOSPITAL_COMMUNITY): Payer: Self-pay

## 2022-06-11 MED ORDER — BUPROPION HCL ER (XL) 300 MG PO TB24
300.0000 mg | ORAL_TABLET | Freq: Every day | ORAL | 1 refills | Status: DC
  Filled 2022-06-11: qty 90, 90d supply, fill #0
  Filled 2022-08-09 – 2022-09-02 (×3): qty 90, 90d supply, fill #1
  Filled 2022-09-07: qty 90, 90d supply, fill #0

## 2022-06-11 NOTE — Telephone Encounter (Signed)
Pt called in regards to med list. States pharmacy showing Bupropion was removed at last UC visit. Upon further review, noticed several duplicates were removed but one Bupropion was still listed in med list. Pt going to try pharmacy again.

## 2022-06-15 ENCOUNTER — Other Ambulatory Visit (HOSPITAL_COMMUNITY): Payer: Self-pay

## 2022-08-09 ENCOUNTER — Other Ambulatory Visit (HOSPITAL_COMMUNITY): Payer: Self-pay

## 2022-08-09 MED ORDER — METOPROLOL TARTRATE 25 MG PO TABS
25.0000 mg | ORAL_TABLET | Freq: Two times a day (BID) | ORAL | 0 refills | Status: DC
  Filled 2022-08-09 – 2022-08-11 (×2): qty 180, 90d supply, fill #0

## 2022-08-11 ENCOUNTER — Other Ambulatory Visit (HOSPITAL_COMMUNITY): Payer: Self-pay

## 2022-08-13 ENCOUNTER — Other Ambulatory Visit (HOSPITAL_COMMUNITY): Payer: Self-pay

## 2022-08-21 ENCOUNTER — Ambulatory Visit (INDEPENDENT_AMBULATORY_CARE_PROVIDER_SITE_OTHER): Payer: No Typology Code available for payment source | Admitting: Nurse Practitioner

## 2022-08-21 ENCOUNTER — Encounter (HOSPITAL_BASED_OUTPATIENT_CLINIC_OR_DEPARTMENT_OTHER): Payer: Self-pay | Admitting: Nurse Practitioner

## 2022-08-21 VITALS — BP 123/73 | HR 88 | Ht 65.0 in | Wt 222.2 lb

## 2022-08-21 DIAGNOSIS — E785 Hyperlipidemia, unspecified: Secondary | ICD-10-CM

## 2022-08-21 DIAGNOSIS — Z79899 Other long term (current) drug therapy: Secondary | ICD-10-CM

## 2022-08-21 DIAGNOSIS — Z23 Encounter for immunization: Secondary | ICD-10-CM | POA: Diagnosis not present

## 2022-08-21 DIAGNOSIS — Z6836 Body mass index (BMI) 36.0-36.9, adult: Secondary | ICD-10-CM

## 2022-08-21 DIAGNOSIS — Z0000 Encounter for general adult medical examination without abnormal findings: Secondary | ICD-10-CM

## 2022-08-21 DIAGNOSIS — I10 Essential (primary) hypertension: Secondary | ICD-10-CM | POA: Diagnosis not present

## 2022-08-21 HISTORY — DX: Other long term (current) drug therapy: Z79.899

## 2022-08-21 MED ORDER — ZOSTER VAC RECOMB ADJUVANTED 50 MCG/0.5ML IM SUSR: 0.5000 mL | Freq: Once | INTRAMUSCULAR | 1 refills | Status: AC

## 2022-08-21 NOTE — Patient Instructions (Signed)
Thank you for choosing [facility] for your Primary Care needs. I am excited for the opportunity to partner with you to meet your health care goals. It was a pleasure meeting you today!  Recommendations from today's visit: We will see what your labs show today and then can come up with a plan for the best medication option for you. If you have any concerns, do not hesitate to reach to me.   Information on diet, exercise, and health maintenance recommendations are listed below. This is information to help you be sure you are on track for optimal health and monitoring.   Please look over this and let us know if you have any questions or if you have completed any of the health maintenance outside of [place] so that we can be sure your records are up to date.  ___________________________________________________________ About Me: I am an Adult-Geriatric Nurse Practitioner with a background in caring for patients for more than 20 years with a strong intensive care background. I provide primary care and sports medicine services to patients age 57 and older within this office. My education had a strong focus on caring for the older adult population, which I am passionate about. I am also the director of the APP Fellowship with [facility].   My desire is to provide you with the best service through preventive medicine and supportive care. I consider you a part of the medical team and value your input. I work diligently to ensure that you are heard and your needs are met in a safe and effective manner. I want you to feel comfortable with me as your provider and want you to know that your health concerns are important to me.  For your information, our office hours are: Monday, Tuesday, and Thursday 8:00 AM - 5:00 PM Wednesday and Friday 8:00 AM - 12:00 PM.   In my time away from the office I am teaching new APP's within the system and am unavailable, but my partner, Dr.  Burnard Bunting is in the office for emergent needs.   If you have questions or concerns, please call our office at [phone] or send [place] a MyChart message and we will respond as quickly as possible.  ____________________________________________________________ MyChart:  For all urgent or time sensitive needs we ask that you please call the office to avoid delays. Our number is [phone]. MyChart is not constantly monitored and due to the large volume of messages a day, replies may take up to 72 business hours.  MyChart Policy: MyChart allows for you to see your visit notes, after visit summary, provider recommendations, lab and tests results, make an appointment, request refills, and contact your provider or the office for non-urgent questions or concerns. Providers are seeing patients during normal business hours and do not have built in time to review MyChart messages.  We ask that you allow a minimum of 3 business days for responses to [employer]. For this reason, please do not send urgent requests through [place]. Please call the office at [phone]. New and ongoing conditions may require a visit. We have virtual and in person visit available for your convenience.  Complex MyChart concerns may require a visit. Your provider may request you schedule a virtual or in person visit to ensure we are providing the best care possible. MyChart messages sent after 11:00 AM on Friday will not be received by the provider until Monday morning.    Lab and Test Results: You  will receive your lab and test results on MyChart as soon as they are completed and results have been sent by the lab or testing facility. Due to this service, you will receive your results BEFORE your provider.  I review lab and tests results each morning prior to seeing patients. Some results require collaboration with other providers to ensure you are receiving the most appropriate care. For this reason, we ask that you please  allow a minimum of 3-5 business days from the time the ALL results have been received for your provider to receive and review lab and test results and contact you about these.  Most lab and test result comments from the provider will be sent through [place]. Your provider may recommend changes to the plan of care, follow-up visits, repeat testing, ask questions, or request an office visit to discuss these results. You may reply directly to this message or call the office at [phone] to provide information for the provider or set up an appointment. In some instances, you will be called with test results and recommendations. Please let us know if this is preferred and we will make note of this in your chart to provide this for you.    If you have not heard a response to your lab or test results in 5 business days from all results returning to [place], please call the office to let us know. We ask that you please avoid calling prior to this time unless there is an emergent concern. Due to high call volumes, this can delay the resulting process.  After Hours: For all non-emergency after hours needs, please call the office at [phone] and select the option to reach the on-call provider service. On-call services are shared between multiple [facility] offices and therefore it will not be possible to speak directly with your provider. On-call providers may provide medical advice and recommendations, but are unable to provide refills for maintenance medications.  For all emergency or urgent medical needs after normal business hours, we recommend that you seek care at the closest Urgent Care or Emergency Department to ensure appropriate treatment in a timely manner.  [facility] [place] has a 24 hour emergency room located on the ground floor for your convenience.   Urgent Concerns During the Business Day Providers are seeing patients from 8AM to [place] with a busy schedule and are most often  not able to respond to non-urgent calls until the end of the day or the next business day. If you should have URGENT concerns during the day, please call and speak to the nurse or schedule a same day appointment so that we can address your concern without delay.   Thank you, again, for choosing me as your health care partner. I appreciate your trust and look forward to learning more about you.   [name], DNP, AGNP-c ___________________________________________________________  Health Maintenance Recommendations Screening Testing Mammogram Every 1 -2 years based on history and risk factors Starting at age 67 Pap Smear Ages 21-39 every 3 years Ages 11-65 every 5 years with HPV testing More frequent testing may be required based on results and history Colon Cancer Screening Every 1-10 years based on test performed, risk factors, and history Starting at age 78 Bone Density Screening Every 2-10 years based on history Starting at age 47 for women Recommendations for men differ based on medication usage, history, and risk factors AAA Screening One time ultrasound Men 45-25 years old who have every smoked Lung Cancer Screening  Low Dose Lung CT every 12 months Age 20-80 years with a 30 pack-year smoking history who still smoke or who have quit within the last 15 years  Screening Labs Routine  Labs: Complete Blood Count (CBC), Complete Metabolic Panel (CMP), Cholesterol (Lipid Panel) Every 6-12 months based on history and medications May be recommended more frequently based on current conditions or previous results Hemoglobin A1c Lab Every 3-12 months based on history and previous results Starting at age 57 or earlier with diagnosis of diabetes, high cholesterol, BMI >26, and/or risk factors Frequent monitoring for patients with diabetes to ensure blood sugar control Thyroid Panel (TSH w/ T3 & T4) Every 6 months based on history, symptoms, and risk factors May be repeated more  often if on medication HIV One time testing for all patients 40 and older May be repeated more frequently for patients with increased risk factors or exposure Hepatitis C One time testing for all patients 59 and older May be repeated more frequently for patients with increased risk factors or exposure Gonorrhea, Chlamydia Every 12 months for all sexually active persons 13-24 years Additional monitoring may be recommended for those who are considered high risk or who have symptoms PSA Men 50-67 years old with risk factors Additional screening may be recommended from age 11-69 based on risk factors, symptoms, and history  Vaccine Recommendations Tetanus Booster All adults every 10 years Flu Vaccine All patients 6 months and older every year COVID Vaccine All patients 12 years and older Initial dosing with booster May recommend additional booster based on age and health history HPV Vaccine 2 doses all patients age 65-26 Dosing may be considered for patients over 26 Shingles Vaccine (Shingrix) 2 doses all adults 65 years and older Pneumonia (Pneumovax 23) All adults 48 years and older May recommend earlier dosing based on health history Pneumonia (Prevnar 33) All adults 29 years and older Dosed 1 year after Pneumovax 23  Additional Screening, Testing, and Vaccinations may be recommended on an individualized basis based on family history, health history, risk factors, and/or exposure.  __________________________________________________________  Diet Recommendations for All Patients  I recommend that all patients maintain a diet low in saturated fats, carbohydrates, and cholesterol. While this can be challenging at first, it is not impossible and small changes can make big differences.  Things to try: Decreasing the amount of soda, sweet tea, and/or juice to one or less per day and replace with water While water is always the first choice, if you do not like water you may  consider adding a water additive without sugar to improve the taste other sugar free drinks Replace potatoes with a brightly colored vegetable at dinner Use healthy oils, such as canola oil or olive oil, instead of butter or hard margarine Limit your bread intake to two pieces or less a day Replace regular pasta with low carb pasta options Bake, broil, or grill foods instead of frying Monitor portion sizes  Eat smaller, more frequent meals throughout the day instead of large meals  An important thing to remember is, if you love foods that are not great for your health, you don't have to give them up completely. Instead, allow these foods to be a reward when you have done well. Allowing yourself to still have special treats every once in a while is a nice way to tell yourself thank you for working hard to keep yourself healthy.   Also remember that every day is a new day. If you have a bad  day and "fall off the wagon", you can still climb right back up and keep moving along on your journey!  We have resources available to help you!  Some websites that may be helpful include: www.http://carter.biz/  Www.VeryWellFit.com _____________________________________________________________  Activity Recommendations for All Patients  I recommend that all adults get at least 20 minutes of moderate physical activity that elevates your heart rate at least 5 days out of the week.  Some examples include: Walking or jogging at a pace that allows you to carry on a conversation Cycling (stationary bike or outdoors) Water aerobics Yoga Weight lifting Dancing If physical limitations prevent you from putting stress on your joints, exercise in a pool or seated in a chair are excellent options.  Do determine your MAXIMUM heart rate for activity: YOUR AGE - 220 = MAX HeartRate   Remember! Do not push yourself too hard.  Start slowly and build up your pace, speed, weight, time in exercise, etc.  Allow your body  to rest between exercise and get good sleep. You will need more water than normal when you are exerting yourself. Do not wait until you are thirsty to drink. Drink with a purpose of getting in at least 8, 8 ounce glasses of water a day plus more depending on how much you exercise and sweat.    If you begin to develop dizziness, chest pain, abdominal pain, jaw pain, shortness of breath, headache, vision changes, lightheadedness, or other concerning symptoms, stop the activity and allow your body to rest. If your symptoms are severe, seek emergency evaluation immediately. If your symptoms are concerning, but not severe, please let us know so that we can recommend further evaluation.

## 2022-08-21 NOTE — Assessment & Plan Note (Signed)
Chronic.  Blood pressure is well controlled today at 123/73.  No changes to medication recommended at this time.  Will obtain labs for kidney and electrolyte function for evaluation.

## 2022-08-21 NOTE — Assessment & Plan Note (Signed)
Patient does have concerns with difficulty losing weight today.  She has previously discussed medication options with her former PCP.  We did discuss additional medication options today that may be helpful for management.  At this time I do feel that baseline labs are appropriate to ensure that there are no underlying conditions that need to be addressed prior to starting with weight management therapy.  Patient is in agreement to this.  We will plan to monitor labs and reevaluate weight management once these results have come in.  We did discuss the option of Qsymia, phentermine, and injectable medications such as [place] or Saxenda.  Final plan will be made with next evaluation.

## 2022-08-21 NOTE — Assessment & Plan Note (Signed)
Chronic.  We will obtain repeat labs today for evaluation.  She is currently managed with omega-3 fatty acids and tolerating well.  No alarm symptoms present at this time.

## 2022-08-21 NOTE — Assessment & Plan Note (Signed)
New patient with CPE performed today.  No alarm symptoms or unusual findings presented. Review of health maintenance activities and discussion with patient on recommendations.  Recommend that she does have the shingles vaccine in the near future.  She is in agreement to this at this time. We will obtain baseline labs for evaluation and monitoring today.  We will plan to follow-up as needed based on laboratory findings.  Recommend CPE in 1 year.

## 2022-08-21 NOTE — Progress Notes (Signed)
Orma Render, DNP, AGNP-c Primary Care & Sports Medicine [address]  [place],  [address] [phone]  New patient visit   Patient: [name]   DOB: 1966/10/31   56 y.o. Female  MRN: [id number] Visit Date: 08/21/2022  Patient Care Team: Orma Render, NP as PCP - General (Nurse Practitioner)  Today's Vitals   08/21/22 0900  BP: 123/73  Pulse: 88  SpO2: 97%  Weight: 222 lb 3.2 oz (100.8 kg)  Height: '5\' 5"'$  (1.651 m)   Body mass index is 36.98 kg/m.   Today's healthcare provider: Orma Render, NP   Chief Complaint  Patient presents with   New Patient (Initial Visit)    Patient presents to establish care.    Subjective    [name] is a 56 y.o. female who presents today as a new patient to establish care.    Patient endorses the following concerns presently: Weight loss- she was speaking about Qsymia with her previous PCP prior to leaving that practice.  She is interested in evaluation and discussion on weight loss methods that may be beneficial for her.  She also has concerns today with some symptoms of ADHD.  She would like to discuss the option of evaluation for this.  She does have a medical history positive for hypertension for which she is taking Lopressor daily. She is taking omega-3 fatty acids for elevated lipid levels.    History reviewed and reveals the following: Past Medical History:  Diagnosis Date   Dizziness 10/21/2015   Essential hypertension 10/21/2015   HSV (herpes simplex virus) infection    Hyperlipidemia 10/21/2015   Kidney calculi    Past Surgical History:  Procedure Laterality Date   TONSILLECTOMY     Family Status  Relation Name Status   Brother  Alive   Mother  Alive   Father  Deceased at age 9       accident   Mat Aunt  Alive   Sister  (Not Specified)   Neg Hx  (Not Specified)   Family History  Problem Relation Age of Onset   CAD Brother    Atrial  fibrillation Sister    Breast cancer Neg Hx    Social History   Socioeconomic History   Marital status: Married    Spouse name: Not on file   Number of children: Not on file   Years of education: Not on file   Highest education level: Not on file  Occupational History   Not on file  Tobacco Use   Smoking status: Former    Years: 30.00    Types: Cigarettes   Smokeless tobacco: Never  Substance and Sexual Activity   Alcohol use: Yes   Drug use: Not Currently    Types: Marijuana    Comment: In college   Sexual activity: Yes  Other Topics Concern   Not on file  Social History Narrative   Epworth Sleepiness Scale = 4 (as of 10/21/2015)   Social Determinants of Health   Financial Resource Strain: Not on file  Food Insecurity: Not on file  Transportation Needs: Not on file  Physical Activity: Not on file  Stress: Not on file  Social Connections: Not on file   Outpatient Medications Prior to Visit  Medication Sig   buPROPion (WELLBUTRIN XL) 300 MG 24 hr tablet Take 1 tablet (300 mg total) by mouth daily.   fish oil-omega-3 fatty acids 1000 MG capsule Take 2 g  by mouth daily.   fluticasone (FLONASE) 50 MCG/ACT nasal spray Place 2 sprays into both nostrils daily.   Melatonin-Pyridoxine (MELATIN PO) Take 1 tablet by mouth at bedtime as needed. For sleep   metoprolol tartrate (LOPRESSOR) 25 MG tablet Take 1 tablet (25 mg total) by mouth 2 (two) times daily.   valACYclovir (VALTREX) 500 MG tablet Take 1 tablet by mouth twice a day for 3 days as needed.   [DISCONTINUED] buPROPion (WELLBUTRIN XL) 300 MG 24 hr tablet Take 300 mg by mouth daily.   [DISCONTINUED] cetirizine (ZYRTEC) 10 MG tablet Take 1 tablet (10 mg total) by mouth daily.   [DISCONTINUED] ibuprofen (ADVIL) 600 MG tablet Take 1 tablet (600 mg total) by mouth every 6 (six) hours as needed.   [DISCONTINUED] mupirocin ointment (BACTROBAN) 2 % Apply 1 application. topically 2 (two) times daily. Use for 7 days.   No  facility-administered medications prior to visit.   No Known Allergies Immunization History  Administered Date(s) Administered   Hepatitis A, Adult 06/22/1997   Influenza Split 10/28/2008, 09/16/2009, 09/11/2010, 09/19/2015, 10/02/2016   Moderna Covid-19 Vaccine Bivalent Booster 38yr & up 11/07/2021   PFIZER(Purple Top)SARS-COV-2 Vaccination 12/23/2019, 01/13/2020   Td 11/09/2003   Tdap 07/12/2014    Review of Systems All review of systems negative except what is listed in the HPI   Objective    BP 123/73   Pulse 88   Ht '5\' 5"'$  (1.651 m)   Wt 222 lb 3.2 oz (100.8 kg)   SpO2 97%   BMI 36.98 kg/m  Physical Exam Vitals and nursing note reviewed.  Constitutional:      General: She is not in acute distress.    Appearance: Normal appearance.  HENT:     Head: Normocephalic and atraumatic.     Right Ear: Hearing, tympanic membrane, ear canal and external ear normal.     Left Ear: Hearing, tympanic membrane, ear canal and external ear normal.     Nose: Nose normal.     Right Sinus: No maxillary sinus tenderness or frontal sinus tenderness.     Left Sinus: No maxillary sinus tenderness or frontal sinus tenderness.     Mouth/Throat:     Lips: Pink.     Mouth: Mucous membranes are moist.     Pharynx: Oropharynx is clear.  Eyes:     General: Lids are normal. Vision grossly intact.     Extraocular Movements: Extraocular movements intact.     Conjunctiva/sclera: Conjunctivae normal.     Pupils: Pupils are equal, round, and reactive to light.     Funduscopic exam:    Right eye: Red reflex present.        Left eye: Red reflex present.    Visual Fields: Right eye visual fields normal and left eye visual fields normal.  Neck:     Thyroid: No thyromegaly.     Vascular: No carotid bruit.  Cardiovascular:     Rate and Rhythm: Normal rate and regular rhythm.     Chest Wall: PMI is not displaced.     Pulses: Normal pulses.          Dorsalis pedis pulses are 2+ on the right side and  2+ on the left side.       Posterior tibial pulses are 2+ on the right side and 2+ on the left side.     Heart sounds: Normal heart sounds. No murmur heard. Pulmonary:     Effort: Pulmonary effort is normal. No respiratory distress.  Breath sounds: Normal breath sounds.  Abdominal:     General: Abdomen is flat. Bowel sounds are normal. There is no distension.     Palpations: Abdomen is soft. There is no hepatomegaly, splenomegaly or mass.     Tenderness: There is no abdominal tenderness. There is no right CVA tenderness, left CVA tenderness, guarding or rebound.  Musculoskeletal:        General: Normal range of motion.     Cervical back: Full passive range of motion without pain, normal range of motion and neck supple. No tenderness.     Right lower leg: No edema.     Left lower leg: No edema.  Feet:     Left foot:     Toenail Condition: Left toenails are normal.  Lymphadenopathy:     Cervical: No cervical adenopathy.     Upper Body:     Right upper body: No supraclavicular adenopathy.     Left upper body: No supraclavicular adenopathy.  Skin:    General: Skin is warm and dry.     Capillary Refill: Capillary refill takes less than 2 seconds.     Nails: There is no clubbing.  Neurological:     General: No focal deficit present.     Mental Status: She is alert and oriented to person, place, and time.     GCS: GCS eye subscore is 4. GCS verbal subscore is 5. GCS motor subscore is 6.     Sensory: Sensation is intact.     Motor: Motor function is intact.     Coordination: Coordination is intact.     Gait: Gait is intact.     Deep Tendon Reflexes: Reflexes are normal and symmetric.  Psychiatric:        Attention and Perception: Attention normal.        Mood and Affect: Mood normal.        Speech: Speech normal.        Behavior: Behavior normal. Behavior is cooperative.        Thought Content: Thought content normal.        Cognition and Memory: Cognition and memory normal.         Judgment: Judgment normal.     No results found for any visits on 08/21/22.  Assessment & Plan      Problem List Items Addressed This Visit     Essential hypertension    Chronic.  Blood pressure is well controlled today at 123/73.  No changes to medication recommended at this time.  Will obtain labs for kidney and electrolyte function for evaluation.      Hyperlipidemia    Chronic.  We will obtain repeat labs today for evaluation.  She is currently managed with omega-3 fatty acids and tolerating well.  No alarm symptoms present at this time.      Encounter for medical examination to establish care - Primary    New patient with CPE performed today.  No alarm symptoms or unusual findings presented. Review of health maintenance activities and discussion with patient on recommendations.  Recommend that she does have the shingles vaccine in the near future.  She is in agreement to this at this time. We will obtain baseline labs for evaluation and monitoring today.  We will plan to follow-up as needed based on laboratory findings.  Recommend CPE in 1 year.      Relevant Orders   CBC With Diff/Platelet   Comprehensive metabolic panel   Hemoglobin A1c  VITAMIN D 25 Hydroxy (Vit-D Deficiency, Fractures)   Thyroid Panel With TSH   Lipid panel   BMI 36.0-36.9,adult    Patient does have concerns with difficulty losing weight today.  She has previously discussed medication options with her former PCP.  We did discuss additional medication options today that may be helpful for management.  At this time I do feel that baseline labs are appropriate to ensure that there are no underlying conditions that need to be addressed prior to starting with weight management therapy.  Patient is in agreement to this.  We will plan to monitor labs and reevaluate weight management once these results have come in.  We did discuss the option of Qsymia, phentermine, and injectable medications such as [place] or  Saxenda.  Final plan will be made with next evaluation.      Other Visit Diagnoses     Healthcare maintenance       Relevant Orders   CBC With Diff/Platelet   Comprehensive metabolic panel   Hemoglobin A1c   VITAMIN D 25 Hydroxy (Vit-D Deficiency, Fractures)   Thyroid Panel With TSH   Lipid panel   Need for shingles vaccine       Relevant Medications   Zoster Vaccine Adjuvanted [facility]) injection        Return for next few weeks for labs and ADHD- we can schedule later if needed. CPE today.      Traylen Eckels, Coralee Pesa, NP, DNP, AGNP-C [facility] at [place]

## 2022-08-22 LAB — COMPREHENSIVE METABOLIC PANEL
ALT: 19 IU/L (ref 0–32)
AST: 14 IU/L (ref 0–40)
Albumin/Globulin Ratio: 1.8 (ref 1.2–2.2)
Albumin: 4.3 g/dL (ref 3.8–4.9)
Alkaline Phosphatase: 67 IU/L (ref 44–121)
BUN/Creatinine Ratio: 13 (ref 9–23)
BUN: 13 mg/dL (ref 6–24)
Bilirubin Total: 0.4 mg/dL (ref 0.0–1.2)
CO2: 23 mmol/L (ref 20–29)
Calcium: 9.4 mg/dL (ref 8.7–10.2)
Chloride: 105 mmol/L (ref 96–106)
Creatinine, Ser: 0.98 mg/dL (ref 0.57–1.00)
Globulin, Total: 2.4 g/dL (ref 1.5–4.5)
Glucose: 98 mg/dL (ref 70–99)
Potassium: 4.8 mmol/L (ref 3.5–5.2)
Sodium: 144 mmol/L (ref 134–144)
Total Protein: 6.7 g/dL (ref 6.0–8.5)
eGFR: 67 mL/min/{1.73_m2} (ref >59)

## 2022-08-22 LAB — CBC WITH DIFF/PLATELET
Basophils Absolute: 0.1 10*3/uL (ref 0.0–0.2)
Basos: 1 %
EOS (ABSOLUTE): 0.1 10*3/uL (ref 0.0–0.4)
Eos: 2 %
Hematocrit: 43.8 % (ref 34.0–46.6)
Hemoglobin: 14.2 g/dL (ref 11.1–15.9)
Immature Grans (Abs): 0 10*3/uL (ref 0.0–0.1)
Immature Granulocytes: 0 %
Lymphocytes Absolute: 2.2 10*3/uL (ref 0.7–3.1)
Lymphs: 32 %
MCH: 30.8 pg (ref 26.6–33.0)
MCHC: 32.4 g/dL (ref 31.5–35.7)
MCV: 95 fL (ref 79–97)
Monocytes Absolute: 0.7 10*3/uL (ref 0.1–0.9)
Monocytes: 10 %
Neutrophils Absolute: 3.8 10*3/uL (ref 1.4–7.0)
Neutrophils: 55 %
Platelets: 221 10*3/uL (ref 150–450)
RBC: 4.61 x10E6/uL (ref 3.77–5.28)
RDW: 13.1 % (ref 11.7–15.4)
WBC: 6.9 10*3/uL (ref 3.4–10.8)

## 2022-08-22 LAB — HEMOGLOBIN A1C
Est. average glucose Bld gHb Est-mCnc: 120 mg/dL
Hgb A1c MFr Bld: 5.8 % — ABNORMAL HIGH (ref 4.8–5.6)

## 2022-08-22 LAB — THYROID PANEL WITH TSH
Free Thyroxine Index: 1.9 (ref 1.2–4.9)
T3 Uptake Ratio: 28 % (ref 24–39)
T4, Total: 6.7 ug/dL (ref 4.5–12.0)
TSH: 2.16 u[IU]/mL (ref 0.450–4.500)

## 2022-08-22 LAB — LIPID PANEL
Chol/HDL Ratio: 3.4 ratio (ref 0.0–4.4)
Cholesterol, Total: 218 mg/dL — ABNORMAL HIGH (ref 100–199)
HDL: 64 mg/dL (ref >39)
LDL Chol Calc (NIH): 128 mg/dL — ABNORMAL HIGH (ref 0–99)
Triglycerides: 151 mg/dL — ABNORMAL HIGH (ref 0–149)
VLDL Cholesterol Cal: 26 mg/dL (ref 5–40)

## 2022-08-22 LAB — VITAMIN D 25 HYDROXY (VIT D DEFICIENCY, FRACTURES): Vit D, 25-Hydroxy: 13.9 ng/mL — ABNORMAL LOW (ref 30.0–100.0)

## 2022-09-04 ENCOUNTER — Other Ambulatory Visit (HOSPITAL_COMMUNITY): Payer: Self-pay

## 2022-09-07 ENCOUNTER — Other Ambulatory Visit (HOSPITAL_BASED_OUTPATIENT_CLINIC_OR_DEPARTMENT_OTHER): Payer: Self-pay

## 2022-09-07 ENCOUNTER — Other Ambulatory Visit (HOSPITAL_COMMUNITY): Payer: Self-pay

## 2022-09-07 MED ORDER — ZOSTER VAC RECOMB ADJUVANTED 50 MCG/0.5ML IM SUSR
INTRAMUSCULAR | 1 refills | Status: DC
  Filled 2022-09-07: qty 0.5, 1d supply, fill #0
  Filled 2023-02-01: qty 0.5, 1d supply, fill #1

## 2022-09-11 ENCOUNTER — Ambulatory Visit (INDEPENDENT_AMBULATORY_CARE_PROVIDER_SITE_OTHER): Payer: No Typology Code available for payment source | Admitting: Nurse Practitioner

## 2022-09-11 ENCOUNTER — Encounter (HOSPITAL_BASED_OUTPATIENT_CLINIC_OR_DEPARTMENT_OTHER): Payer: Self-pay | Admitting: Nurse Practitioner

## 2022-09-11 ENCOUNTER — Other Ambulatory Visit (HOSPITAL_BASED_OUTPATIENT_CLINIC_OR_DEPARTMENT_OTHER): Payer: Self-pay

## 2022-09-11 VITALS — BP 127/69 | HR 85 | Ht 65.0 in | Wt 222.3 lb

## 2022-09-11 DIAGNOSIS — R7303 Prediabetes: Secondary | ICD-10-CM | POA: Diagnosis not present

## 2022-09-11 DIAGNOSIS — Z6836 Body mass index (BMI) 36.0-36.9, adult: Secondary | ICD-10-CM

## 2022-09-11 DIAGNOSIS — E782 Mixed hyperlipidemia: Secondary | ICD-10-CM

## 2022-09-11 DIAGNOSIS — I10 Essential (primary) hypertension: Secondary | ICD-10-CM

## 2022-09-11 DIAGNOSIS — E559 Vitamin D deficiency, unspecified: Secondary | ICD-10-CM | POA: Diagnosis not present

## 2022-09-11 MED ORDER — VITAMIN D (ERGOCALCIFEROL) 1.25 MG (50000 UNIT) PO CAPS
50000.0000 [IU] | ORAL_CAPSULE | ORAL | 3 refills | Status: DC
  Filled 2022-09-11: qty 12, 84d supply, fill #0
  Filled 2022-11-04 – 2022-11-26 (×3): qty 12, 84d supply, fill #1
  Filled 2023-02-13: qty 12, 84d supply, fill #2
  Filled 2023-05-02: qty 12, 84d supply, fill #3

## 2022-09-11 MED ORDER — PHENTERMINE-TOPIRAMATE ER 7.5-46 MG PO CP24
1.0000 | ORAL_CAPSULE | Freq: Every day | ORAL | 1 refills | Status: DC
  Filled 2022-09-11: qty 30, fill #0

## 2022-09-11 MED ORDER — PHENTERMINE-TOPIRAMATE ER 3.75-23 MG PO CP24
1.0000 | ORAL_CAPSULE | Freq: Every day | ORAL | 0 refills | Status: DC
  Filled 2022-09-11 – 2022-10-01 (×4): qty 14, 14d supply, fill #0

## 2022-09-11 NOTE — Progress Notes (Signed)
[name], DNP, AGNP-c [place], [place] [address] [phone] Office [phone] Fax  ESTABLISHED PATIENT- Chronic Health and/or Follow-Up Visit  Blood pressure 127/69, pulse 85, height '5\' 5"'$  (1.651 m), weight 222 lb 4.8 oz (100.8 kg), SpO2 98 %.    [name] is a 56 y.o. year old female presenting today for evaluation and management of the following: Follow-up   Weight Management -[name] endorses concerns with her weight.  She has been working on diet and and exercise however has not seen any significant changes.  She expresses that she is frustrated and would like to discuss an option for medication management that may help to boost her metabolism or help jumpstart her weight loss to keep her encouraged.  She has been on phentermine in the past and has had success with this medication.   In general she would like to improve her overall health and reduce her comorbidities.  All ROS negative with exception of what is listed above.   PHYSICAL EXAM Physical Exam Vitals and nursing note reviewed.  Constitutional:      General: She is not in acute distress.    Appearance: Normal appearance.  HENT:     Head: Normocephalic.  Eyes:     Extraocular Movements: Extraocular movements intact.     Conjunctiva/sclera: Conjunctivae normal.     Pupils: Pupils are equal, round, and reactive to light.  Neck:     Vascular: No carotid bruit.  Cardiovascular:     Rate and Rhythm: Normal rate and regular rhythm.     Pulses: Normal pulses.     Heart sounds: Normal heart sounds. No murmur heard. Pulmonary:     Effort: Pulmonary effort is normal.     Breath sounds: Normal breath sounds. No wheezing.  Musculoskeletal:        General: Normal range of motion.     Cervical back: Normal range of motion and neck supple.     Right lower leg: No edema.     Left lower leg: No edema.  Lymphadenopathy:      Cervical: No cervical adenopathy.  Skin:    General: Skin is warm and dry.     Capillary Refill: Capillary refill takes less than 2 seconds.  Neurological:     General: No focal deficit present.     Mental Status: She is alert and oriented to person, place, and time.  Psychiatric:        Mood and Affect: Mood normal.        Behavior: Behavior normal.        Thought Content: Thought content normal.        Judgment: Judgment normal.     PLAN Problem List Items Addressed This Visit     Essential hypertension    Chronic.  Blood pressure well controlled on current regimen.  I do feel that increased weight management would be helpful for overall blood pressure and cardiovascular health.  We will plan to start weight management with Qsymia and monitor closely.      Hyperlipidemia    Chronic.  Currently on omega-3 fatty acids.  Continue to monitor diet and exercise.  We will start Qsymia for dietary supplement to see if this is helpful for management.      BMI 36.0-36.9,adult - Primary    Elevated BMI with comorbidities including hypertension, hyperlipidemia, and prediabetes.  She does have increased risk factors for increased morbidity.  She has been  unsuccessful with diet and exercise alone.  In the past she has had luck with phentermine.  Joint decision today to trial Qsymia to see if this is helpful for weight management.  We will plan to follow-up to see how she is doing in 3 months.      Pre-diabetes    Elevated hemoglobin A1c on recent labs to the point of prediabetes.  Given her other comorbidities I do feel that it is essential for her to get her weight under control to help her overall health.  Decision made today to trial Qsymia to see if this is helpful for weight management.  She is already working on diet and exercise.  She is planned tried phentermine in the past and did have some success with this.  We will plan to follow-up in 3 months or sooner if needed.      Other Visit  Diagnoses     Vitamin D deficiency       Relevant Medications   Vitamin D, Ergocalciferol, (DRISDOL) 1.25 MG (50000 UNIT) CAPS capsule       Return in about 3 months (around 12/11/2022) for Weight- Virtual.   [name], DNP, AGNP-c 09/11/2022  8:59 AM

## 2022-09-11 NOTE — Patient Instructions (Addendum)
I have sent in the Qsymia for you- lets see how you do on this! Let me know if you have any issues.

## 2022-09-12 ENCOUNTER — Other Ambulatory Visit (HOSPITAL_BASED_OUTPATIENT_CLINIC_OR_DEPARTMENT_OTHER): Payer: Self-pay

## 2022-09-13 ENCOUNTER — Other Ambulatory Visit (HOSPITAL_BASED_OUTPATIENT_CLINIC_OR_DEPARTMENT_OTHER): Payer: Self-pay

## 2022-09-17 ENCOUNTER — Other Ambulatory Visit (HOSPITAL_BASED_OUTPATIENT_CLINIC_OR_DEPARTMENT_OTHER): Payer: Self-pay

## 2022-09-18 ENCOUNTER — Other Ambulatory Visit (HOSPITAL_BASED_OUTPATIENT_CLINIC_OR_DEPARTMENT_OTHER): Payer: Self-pay

## 2022-09-18 ENCOUNTER — Encounter (HOSPITAL_BASED_OUTPATIENT_CLINIC_OR_DEPARTMENT_OTHER): Payer: Self-pay | Admitting: Nurse Practitioner

## 2022-09-20 ENCOUNTER — Other Ambulatory Visit (HOSPITAL_BASED_OUTPATIENT_CLINIC_OR_DEPARTMENT_OTHER): Payer: Self-pay | Admitting: Nurse Practitioner

## 2022-09-20 ENCOUNTER — Other Ambulatory Visit (HOSPITAL_BASED_OUTPATIENT_CLINIC_OR_DEPARTMENT_OTHER): Payer: Self-pay

## 2022-09-20 DIAGNOSIS — Z6836 Body mass index (BMI) 36.0-36.9, adult: Secondary | ICD-10-CM

## 2022-09-20 DIAGNOSIS — E785 Hyperlipidemia, unspecified: Secondary | ICD-10-CM

## 2022-09-20 MED ORDER — PHENTERMINE HCL 37.5 MG PO TABS
37.5000 mg | ORAL_TABLET | Freq: Every day | ORAL | 2 refills | Status: DC
  Filled 2022-09-20: qty 30, 30d supply, fill #0

## 2022-09-29 DIAGNOSIS — R7303 Prediabetes: Secondary | ICD-10-CM | POA: Insufficient documentation

## 2022-09-29 NOTE — Assessment & Plan Note (Signed)
Chronic.  Currently on omega-3 fatty acids.  Continue to monitor diet and exercise.  We will start Qsymia for dietary supplement to see if this is helpful for management.

## 2022-09-29 NOTE — Assessment & Plan Note (Signed)
Chronic.  Blood pressure well controlled on current regimen.  I do feel that increased weight management would be helpful for overall blood pressure and cardiovascular health.  We will plan to start weight management with Qsymia and monitor closely.

## 2022-09-29 NOTE — Assessment & Plan Note (Signed)
Elevated hemoglobin A1c on recent labs to the point of prediabetes.  Given her other comorbidities I do feel that it is essential for her to get her weight under control to help her overall health.  Decision made today to trial Qsymia to see if this is helpful for weight management.  She is already working on diet and exercise.  She is planned tried phentermine in the past and did have some success with this.  We will plan to follow-up in 3 months or sooner if needed.

## 2022-09-29 NOTE — Assessment & Plan Note (Signed)
Elevated BMI with comorbidities including hypertension, hyperlipidemia, and prediabetes.  She does have increased risk factors for increased morbidity.  She has been unsuccessful with diet and exercise alone.  In the past she has had luck with phentermine.  Joint decision today to trial Qsymia to see if this is helpful for weight management.  We will plan to follow-up to see how she is doing in 3 months.

## 2022-10-01 ENCOUNTER — Other Ambulatory Visit (HOSPITAL_BASED_OUTPATIENT_CLINIC_OR_DEPARTMENT_OTHER): Payer: Self-pay

## 2022-10-02 ENCOUNTER — Other Ambulatory Visit (HOSPITAL_COMMUNITY): Payer: Self-pay

## 2022-10-02 ENCOUNTER — Other Ambulatory Visit (HOSPITAL_BASED_OUTPATIENT_CLINIC_OR_DEPARTMENT_OTHER): Payer: Self-pay | Admitting: Nurse Practitioner

## 2022-10-02 ENCOUNTER — Other Ambulatory Visit (HOSPITAL_BASED_OUTPATIENT_CLINIC_OR_DEPARTMENT_OTHER): Payer: Self-pay

## 2022-10-02 DIAGNOSIS — R7303 Prediabetes: Secondary | ICD-10-CM

## 2022-10-02 DIAGNOSIS — Z6836 Body mass index (BMI) 36.0-36.9, adult: Secondary | ICD-10-CM

## 2022-10-02 MED ORDER — PHENTERMINE-TOPIRAMATE ER 3.75-23 MG PO CP24
ORAL_CAPSULE | ORAL | 0 refills | Status: DC
  Filled 2022-10-02: qty 14, 14d supply, fill #0

## 2022-10-02 MED ORDER — PHENTERMINE-TOPIRAMATE ER 7.5-46 MG PO CP24
ORAL_CAPSULE | ORAL | 0 refills | Status: DC
  Filled 2022-10-02: qty 30, 30d supply, fill #0

## 2022-10-03 ENCOUNTER — Telehealth (HOSPITAL_BASED_OUTPATIENT_CLINIC_OR_DEPARTMENT_OTHER): Payer: Self-pay

## 2022-10-03 NOTE — Telephone Encounter (Signed)
Received questionnaire from [place]. All forms were completed and faxed back to [place] for Qsymia 3.'75mg'$    [name]

## 2022-10-04 ENCOUNTER — Other Ambulatory Visit (HOSPITAL_COMMUNITY): Payer: Self-pay

## 2022-10-05 ENCOUNTER — Other Ambulatory Visit (HOSPITAL_COMMUNITY): Payer: Self-pay

## 2022-10-11 ENCOUNTER — Other Ambulatory Visit (HOSPITAL_COMMUNITY): Payer: Self-pay

## 2022-10-17 ENCOUNTER — Other Ambulatory Visit (HOSPITAL_BASED_OUTPATIENT_CLINIC_OR_DEPARTMENT_OTHER): Payer: Self-pay

## 2022-10-18 ENCOUNTER — Encounter (HOSPITAL_BASED_OUTPATIENT_CLINIC_OR_DEPARTMENT_OTHER): Payer: Self-pay | Admitting: Nurse Practitioner

## 2022-11-04 ENCOUNTER — Other Ambulatory Visit (HOSPITAL_COMMUNITY): Payer: Self-pay

## 2022-11-05 ENCOUNTER — Other Ambulatory Visit (HOSPITAL_BASED_OUTPATIENT_CLINIC_OR_DEPARTMENT_OTHER): Payer: Self-pay

## 2022-11-06 ENCOUNTER — Telehealth: Payer: Self-pay | Admitting: Gastroenterology

## 2022-11-06 NOTE — Telephone Encounter (Signed)
Good Morning Dr.Beavers,  We received a referral on this patient to be seen for hemorrhoids. She has previous GI hx at [facility]. She is requesting a transfer because she would like to be seen with a female provider and she was referred to you by her pcp.  We have records for review, please advise on scheduling. Thank you.

## 2022-11-07 ENCOUNTER — Other Ambulatory Visit: Payer: Self-pay | Admitting: Nurse Practitioner

## 2022-11-07 ENCOUNTER — Other Ambulatory Visit (HOSPITAL_BASED_OUTPATIENT_CLINIC_OR_DEPARTMENT_OTHER): Payer: Self-pay

## 2022-11-07 ENCOUNTER — Other Ambulatory Visit (HOSPITAL_COMMUNITY): Payer: Self-pay

## 2022-11-07 ENCOUNTER — Other Ambulatory Visit (HOSPITAL_BASED_OUTPATIENT_CLINIC_OR_DEPARTMENT_OTHER): Payer: Self-pay | Admitting: Nurse Practitioner

## 2022-11-07 DIAGNOSIS — E785 Hyperlipidemia, unspecified: Secondary | ICD-10-CM

## 2022-11-07 DIAGNOSIS — Z6836 Body mass index (BMI) 36.0-36.9, adult: Secondary | ICD-10-CM

## 2022-11-07 DIAGNOSIS — Z1231 Encounter for screening mammogram for malignant neoplasm of breast: Secondary | ICD-10-CM

## 2022-11-08 ENCOUNTER — Other Ambulatory Visit (HOSPITAL_BASED_OUTPATIENT_CLINIC_OR_DEPARTMENT_OTHER): Payer: Self-pay | Admitting: Nurse Practitioner

## 2022-11-08 ENCOUNTER — Other Ambulatory Visit (HOSPITAL_BASED_OUTPATIENT_CLINIC_OR_DEPARTMENT_OTHER): Payer: Self-pay

## 2022-11-08 ENCOUNTER — Other Ambulatory Visit (HOSPITAL_COMMUNITY): Payer: Self-pay

## 2022-11-08 DIAGNOSIS — I10 Essential (primary) hypertension: Secondary | ICD-10-CM

## 2022-11-08 MED ORDER — METOPROLOL TARTRATE 25 MG PO TABS
25.0000 mg | ORAL_TABLET | Freq: Two times a day (BID) | ORAL | 3 refills | Status: DC
  Filled 2022-11-08: qty 180, 90d supply, fill #0

## 2022-11-08 MED ORDER — METOPROLOL TARTRATE 25 MG PO TABS
25.0000 mg | ORAL_TABLET | Freq: Two times a day (BID) | ORAL | 3 refills | Status: DC
  Filled 2023-02-02: qty 180, 90d supply, fill #0
  Filled 2023-03-13 – 2023-05-02 (×2): qty 180, 90d supply, fill #1
  Filled 2023-07-28 – 2023-08-01 (×2): qty 180, 90d supply, fill #2
  Filled 2023-10-27: qty 180, 90d supply, fill #3

## 2022-11-13 ENCOUNTER — Encounter (HOSPITAL_BASED_OUTPATIENT_CLINIC_OR_DEPARTMENT_OTHER): Payer: Self-pay | Admitting: Nurse Practitioner

## 2022-11-13 ENCOUNTER — Telehealth (INDEPENDENT_AMBULATORY_CARE_PROVIDER_SITE_OTHER): Payer: No Typology Code available for payment source | Admitting: Nurse Practitioner

## 2022-11-13 ENCOUNTER — Other Ambulatory Visit (HOSPITAL_COMMUNITY): Payer: Self-pay

## 2022-11-13 DIAGNOSIS — R7303 Prediabetes: Secondary | ICD-10-CM

## 2022-11-13 DIAGNOSIS — E782 Mixed hyperlipidemia: Secondary | ICD-10-CM

## 2022-11-13 DIAGNOSIS — Z6836 Body mass index (BMI) 36.0-36.9, adult: Secondary | ICD-10-CM

## 2022-11-13 DIAGNOSIS — I10 Essential (primary) hypertension: Secondary | ICD-10-CM

## 2022-11-13 MED ORDER — PHENTERMINE HCL 37.5 MG PO TABS
37.5000 mg | ORAL_TABLET | Freq: Every day | ORAL | 0 refills | Status: DC
  Filled 2022-11-13: qty 90, 90d supply, fill #0

## 2022-11-13 MED ORDER — BUPROPION HCL ER (XL) 300 MG PO TB24
300.0000 mg | ORAL_TABLET | Freq: Every day | ORAL | 3 refills | Status: DC
  Filled 2022-11-13 – 2022-11-26 (×2): qty 90, 90d supply, fill #0
  Filled 2023-03-03: qty 90, 90d supply, fill #1
  Filled 2023-05-29: qty 90, 90d supply, fill #2
  Filled 2023-08-29: qty 90, 90d supply, fill #3

## 2022-11-13 NOTE — Progress Notes (Signed)
Virtual Visit Encounter mychart visit.   I connected with  [name] on 12/28/22 at  9:30 AM EST by secure video and audio telemedicine application. I verified that I am speaking with the correct person using two identifiers.   I introduced myself as a Designer, jewellery with the practice. The limitations of evaluation and management by telemedicine discussed with the patient and the availability of in person appointments. The patient expressed verbal understanding and consent to proceed.  Participating parties in this visit include: Myself and patient  The patient is: Patient Location: Home I am: Provider Location: Office/Clinic Subjective:    CC and HPI: [name] is a 56 y.o. year old female presenting for follow up of weight management. Patient reports the following: Weight Management - Unable to get coverage for Qsymia. Wegovy not in stock.  - Had old rx of phentermine and this was helpful, interested in trying  Vitamin D - taking, but not feeling any different  Past medical history, Surgical history, Family history not pertinant except as noted below, Social history, Allergies, and medications have been entered into the medical record, reviewed, and corrections made.   Review of Systems:  All review of systems negative except what is listed in the HPI  Objective:    Alert and oriented x 4 Speaking in clear sentences with no shortness of breath. No distress.  Impression and Recommendations:    Problem List Items Addressed This Visit     Essential hypertension    Chronic hypertension.  Given the patient's current comorbidities weight loss is recommended for overall health.  Discussed the option of starting phentermine today and feel this is reasonable however do want her to monitor her blood pressure closely to ensure that blood pressures are not increasing with use.      Relevant Medications   phentermine (ADIPEX-P) 37.5 MG tablet    buPROPion (WELLBUTRIN XL) 300 MG 24 hr tablet   Hyperlipidemia    Chronic.  Weight loss advised.  Will start phentermine.      Relevant Medications   phentermine (ADIPEX-P) 37.5 MG tablet   buPROPion (WELLBUTRIN XL) 300 MG 24 hr tablet   BMI 36.0-36.9,adult - Primary    Patient has been working on diet and exercise and unfortunately this has not resulted in significant weight loss despite her efforts.  We have discussed the option of various medications in the past however insurance coverage has been difficult.  At this time Mancel Parsons is not available at the pharmacy therefore we are limited in options.  Seeing that the patient has tried phentermine in the past and tolerated this well with good success we can consider trying this again.  I am hopeful that the [facility] stock will improve over the next few months and we will be able to get her started on that as we will not be able to continue on the phentermine long-term.  We will plan to follow-up in 3 months and see how she is doing.      Relevant Medications   phentermine (ADIPEX-P) 37.5 MG tablet   buPROPion (WELLBUTRIN XL) 300 MG 24 hr tablet   Pre-diabetes    Chronic.  Diet and exercise in place.  Weight loss advised.  Will start phentermine.      Relevant Medications   phentermine (ADIPEX-P) 37.5 MG tablet   buPROPion (WELLBUTRIN XL) 300 MG 24 hr tablet    orders and follow up as documented in EMR I discussed the assessment and treatment plan  with the patient. The patient was provided an opportunity to ask questions and all were answered. The patient agreed with the plan and demonstrated an understanding of the instructions.   The patient was advised to call back or seek an in-person evaluation if the symptoms worsen or if the condition fails to improve as anticipated.  Follow-Up: in 3 months  I provided 18 minutes of non-face-to-face interaction with this non face-to-face encounter including intake, same-day documentation, and chart  review.   Orma Render, NP , DNP, AGNP-c [place] at [facility] [phone] (fax)

## 2022-11-13 NOTE — Patient Instructions (Addendum)
I have sent in a prescription for phentermine to the pharmacy [facility]) we can plan to stay on that and work towards your weight loss goal while we wait for [facility] stock to come back. We can plan to touch base in February and see how you are doing and recheck vitamin D levels at that time, as well.   If you are not feeling any better or start feeling worse, please reach out and let me know.

## 2022-11-26 NOTE — Telephone Encounter (Signed)
Records from Dr. Paulita Fujita were reviewed.  Colonoscopy in 2016 revealed a 10 mm hyperplastic polyp in the ascending colon and was otherwise normal.   Colonoscopy for personal history of colonoscopy 10/12/2021 revealed a 3 mm transverse colon tubular adenoma.  The report is otherwise noted as normal.  Dr. Paulita Fujita I recommended a surveillance colonoscopy in 5 years.  Referral appears to be for hemorrhoids.  Given this very focused request I will see if Dr. Lorenso Courier would have any interest in seeing this patient as she performs hemorrhoidal banding.

## 2022-11-27 ENCOUNTER — Other Ambulatory Visit (HOSPITAL_COMMUNITY): Payer: Self-pay

## 2022-11-27 ENCOUNTER — Encounter: Payer: Self-pay | Admitting: Gastroenterology

## 2022-11-27 NOTE — Telephone Encounter (Signed)
LVM for patient to call and schedule

## 2022-12-28 NOTE — Assessment & Plan Note (Signed)
Patient has been working on diet and exercise and unfortunately this has not resulted in significant weight loss despite her efforts.  We have discussed the option of various medications in the past however insurance coverage has been difficult.  At this time [name] is not available at the pharmacy therefore we are limited in options.  Seeing that the patient has tried phentermine in the past and tolerated this well with good success we can consider trying this again.  I am hopeful that the [facility] stock will improve over the next few months and we will be able to get her started on that as we will not be able to continue on the phentermine long-term.  We will plan to follow-up in 3 months and see how she is doing.

## 2022-12-28 NOTE — Assessment & Plan Note (Signed)
Chronic hypertension.  Given the patient's current comorbidities weight loss is recommended for overall health.  Discussed the option of starting phentermine today and feel this is reasonable however do want her to monitor her blood pressure closely to ensure that blood pressures are not increasing with use.

## 2022-12-28 NOTE — Assessment & Plan Note (Signed)
Chronic.  Diet and exercise in place.  Weight loss advised.  Will start phentermine.

## 2022-12-28 NOTE — Assessment & Plan Note (Signed)
Chronic.  Weight loss advised.  Will start phentermine.

## 2023-01-02 ENCOUNTER — Encounter: Payer: Self-pay | Admitting: Internal Medicine

## 2023-01-04 ENCOUNTER — Ambulatory Visit
Admission: RE | Admit: 2023-01-04 | Discharge: 2023-01-04 | Disposition: A | Discharge status: Home / Self Care | Payer: 59 | Source: Ambulatory Visit

## 2023-01-04 DIAGNOSIS — Z1231 Encounter for screening mammogram for malignant neoplasm of breast: Secondary | ICD-10-CM

## 2023-01-10 ENCOUNTER — Ambulatory Visit: Payer: Self-pay | Admitting: Gastroenterology

## 2023-01-15 ENCOUNTER — Ambulatory Visit: Payer: 59 | Admitting: Internal Medicine

## 2023-02-01 ENCOUNTER — Other Ambulatory Visit (HOSPITAL_COMMUNITY): Payer: Self-pay

## 2023-02-04 ENCOUNTER — Other Ambulatory Visit (HOSPITAL_COMMUNITY): Payer: Self-pay

## 2023-02-07 ENCOUNTER — Ambulatory Visit (INDEPENDENT_AMBULATORY_CARE_PROVIDER_SITE_OTHER): Payer: 59 | Admitting: Nurse Practitioner

## 2023-02-07 ENCOUNTER — Other Ambulatory Visit (HOSPITAL_COMMUNITY): Payer: Self-pay

## 2023-02-07 ENCOUNTER — Encounter: Payer: Self-pay | Admitting: Nurse Practitioner

## 2023-02-07 VITALS — BP 126/80 | HR 65 | Wt 215.6 lb

## 2023-02-07 DIAGNOSIS — R6882 Decreased libido: Secondary | ICD-10-CM

## 2023-02-07 DIAGNOSIS — E559 Vitamin D deficiency, unspecified: Secondary | ICD-10-CM | POA: Insufficient documentation

## 2023-02-07 DIAGNOSIS — I10 Essential (primary) hypertension: Secondary | ICD-10-CM | POA: Diagnosis not present

## 2023-02-07 DIAGNOSIS — R110 Nausea: Secondary | ICD-10-CM | POA: Diagnosis not present

## 2023-02-07 DIAGNOSIS — R7303 Prediabetes: Secondary | ICD-10-CM

## 2023-02-07 DIAGNOSIS — Z79899 Other long term (current) drug therapy: Secondary | ICD-10-CM | POA: Diagnosis not present

## 2023-02-07 DIAGNOSIS — E785 Hyperlipidemia, unspecified: Secondary | ICD-10-CM | POA: Diagnosis not present

## 2023-02-07 DIAGNOSIS — Z6836 Body mass index (BMI) 36.0-36.9, adult: Secondary | ICD-10-CM

## 2023-02-07 DIAGNOSIS — R2233 Localized swelling, mass and lump, upper limb, bilateral: Secondary | ICD-10-CM | POA: Diagnosis not present

## 2023-02-07 DIAGNOSIS — M151 Heberden's nodes (with arthropathy): Secondary | ICD-10-CM | POA: Insufficient documentation

## 2023-02-07 MED ORDER — ZEPBOUND 5 MG/0.5ML ~~LOC~~ SOAJ
5.0000 mg | SUBCUTANEOUS | 0 refills | Status: DC
  Filled 2023-02-07: qty 2, 28d supply, fill #0

## 2023-02-07 MED ORDER — ONDANSETRON HCL 4 MG PO TABS
4.0000 mg | ORAL_TABLET | Freq: Three times a day (TID) | ORAL | 0 refills | Status: DC | PRN
  Filled 2023-02-07: qty 20, 7d supply, fill #0

## 2023-02-07 MED ORDER — ZEPBOUND 2.5 MG/0.5ML ~~LOC~~ SOAJ
2.5000 mg | SUBCUTANEOUS | 0 refills | Status: DC
  Filled 2023-02-07 – 2023-02-24 (×2): qty 2, 28d supply, fill #0

## 2023-02-07 NOTE — Assessment & Plan Note (Signed)
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 Exam shows firm, flex colored nodules on right ring finger at the distal joint and left thumb joint, with no pain but stiffness in hands. 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 Discussion of imaging vs. Watchful waiting approach. Joint decision to hold of on x-ray at this time.  Plan:   - Educate patient on Heberden nodes and their association with osteoarthritis.   - Recommend over-the-counter Tylenol for aching as needed.   - Monitor for any changes or worsening of symptoms, and consider referral to a specialist if needed.

## 2023-02-07 NOTE — Assessment & Plan Note (Signed)
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 Exam shows firm, flex colored nodules on right ring finger at the distal joint and left thumb joint, with no pain but stiffness in hands. 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 Discussion of imaging vs. Watchful waiting approach. Joint decision to hold of on x-ray at this time.  Plan:   - Educate patient on Heberden nodes and their association with osteoarthritis.   - Recommend over-the-counter Tylenol for aching as needed.   - Monitor for any changes or worsening of symptoms, and consider referral to a specialist if needed.

## 2023-02-07 NOTE — Assessment & Plan Note (Signed)
Patient is currently taking metoprolol, Wellbutrin, and vitamin D. Plan:   - Continue current medications as prescribed.   - Send in Zofran prescription for potential nausea with new weight loss medication.   - Confirm that Valtrex prescription is not due yet and that the patient has a surplus.

## 2023-02-07 NOTE — Assessment & Plan Note (Signed)
Currently on treatment.  Plan: - Continue current treatment.  - Labs today.

## 2023-02-07 NOTE — Assessment & Plan Note (Signed)
Patient's previous labs showed elevated cholesterol levels. Plan:   - Encourage patient to maintain a heart-healthy diet and regular physical activity.   - Monitor cholesterol levels during future lab work and consider medication if necessary.

## 2023-02-07 NOTE — Assessment & Plan Note (Addendum)
Patient reports leveling off on phentermine and is interested in trying a new medication for weight loss. Plan:   - Discontinue phentermine.   - Submit prior authorization for tirzepatide (ZepBound) and provide patient with information on how to get a coupon code for a three-month supply.   - If authorization is not granted, consider Wegovy as an alternative.   - Encourage patient to continue with lifestyle modifications, including smaller portion sizes, increased water intake, and regular physical activity. - Send in Zofran prescription for potential nausea with new weight loss medication.

## 2023-02-07 NOTE — Progress Notes (Signed)
Tara Keeler, DNP, AGNP-c [name]  [address] [place], [name] [address] [phone]  ESTABLISHED PATIENT- Chronic Health and/or Follow-Up Visit  Blood pressure 126/80, pulse 65, weight 215 lb 9.6 oz (97.8 kg).    [name] is a 57 y.o. year old female presenting today for evaluation and management of the following: Heberden Nodes The patient reports the development of firm nodules on her hands, specifically on the right ring finger at the distal joint and the left thumb. She denies any pain associated with these nodules but mentions stiffness in her hands. The patient has researched and suspects these nodules may be Heberden nodes, potentially related to osteoarthritis.  Weight The patient also expresses concerns about her weight..  She has been taking phentermine for weight management but feels that it may not be as effective as before. She reports her weight loss has plateaued. The patient is interested in exploring other weight loss medication options, such as Wegovy or ZepBound. She has been waiting on Wegovy to be more readily available. She is watching what she eats and has made changes to her diet. She is exercising daily.   Low Libido The patient does not provide specific details but is interested in having her testosterone levels checked to see if they may be contributing to the issues of decreased desire and difficulty climaxing. She is postmenopausal and acknowledges the potential hormonal changes that may be affecting her libido.She reports that her mood is stable and good.  The patient's last labs showed slightly elevated cholesterol levels. She is currently taking vitamin D once a week, metoprolol for blood pressure management, and Wellbutrin. She also has a prescription for Valtrex but reports having a surplus of the medication.  All ROS negative with exception of what is listed above.   PHYSICAL EXAM Physical Exam Vitals and  nursing note reviewed.  Constitutional:      General: She is not in acute distress.    Appearance: Normal appearance.  HENT:     Head: Normocephalic.  Eyes:     Extraocular Movements: Extraocular movements intact.     Conjunctiva/sclera: Conjunctivae normal.     Pupils: Pupils are equal, round, and reactive to light.  Neck:     Vascular: No carotid bruit.  Cardiovascular:     Rate and Rhythm: Normal rate and regular rhythm.     Pulses: Normal pulses.     Heart sounds: Normal heart sounds. No murmur heard. Pulmonary:     Effort: Pulmonary effort is normal.     Breath sounds: Normal breath sounds. No wheezing.  Abdominal:     General: Bowel sounds are normal. There is no distension.     Palpations: Abdomen is soft.     Tenderness: There is no abdominal tenderness. There is no guarding.  Musculoskeletal:        General: Normal range of motion.     Cervical back: Normal range of motion and neck supple.     Right lower leg: No edema.     Left lower leg: No edema.  Lymphadenopathy:     Cervical: No cervical adenopathy.  Skin:    General: Skin is warm and dry.     Capillary Refill: Capillary refill takes less than 2 seconds.     Comments: Firm nodes observed on right ring finger at the distal joint and left thumb at the only joint, possibly Heberden nodes associated with osteoarthritis.  Neurological:     General: No focal deficit present.  Mental Status: She is alert and oriented to person, place, and time.  Psychiatric:        Mood and Affect: Mood normal.        Behavior: Behavior normal.        Thought Content: Thought content normal.        Judgment: Judgment normal.     PLAN Problem List Items Addressed This Visit     Hyperlipidemia    Patient's previous labs showed elevated cholesterol levels. Plan:   - Encourage patient to maintain a heart-healthy diet and regular physical activity.   - Monitor cholesterol levels during future lab work and consider medication  if necessary.      Relevant Medications   tirzepatide (ZEPBOUND) 2.5 MG/0.5ML Pen   tirzepatide (ZEPBOUND) 5 MG/0.5ML Pen   Other Relevant Orders   CBC with Differential/Platelet   Comprehensive metabolic panel   Hemoglobin A1c   Lipid panel   Medication management    Patient is currently taking metoprolol, Wellbutrin, and vitamin D. Plan:   - Continue current medications as prescribed.   - Send in Zofran prescription for potential nausea with new weight loss medication.   - Confirm that Valtrex prescription is not due yet and that the patient has a surplus.      BMI 36.0-36.9,adult    Patient reports leveling off on phentermine and is interested in trying a new medication for weight loss. Plan:   - Discontinue phentermine.   - Submit prior authorization for tirzepatide (ZepBound) and provide patient with information on how to get a coupon code for a three-month supply.   - If authorization is not granted, consider Wegovy as an alternative.   - Encourage patient to continue with lifestyle modifications, including smaller portion sizes, increased water intake, and regular physical activity. - Send in Zofran prescription for potential nausea with new weight loss medication.       Relevant Medications   tirzepatide (ZEPBOUND) 2.5 MG/0.5ML Pen   tirzepatide (ZEPBOUND) 5 MG/0.5ML Pen   Other Relevant Orders   CBC with Differential/Platelet   Comprehensive metabolic panel   Hemoglobin A1c   Lipid panel   Pre-diabetes    Following diet and exercise.  Plan: - labs today. - Stop phentermine - start zepbound.  - continue with diet and exercise.       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 Exam shows firm, flex colored  nodules on right ring finger at the distal joint and left thumb joint, with no pain but stiffness in hands. 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 Discussion of imaging vs. Watchful waiting approach. Joint decision to hold of on x-ray at this time.  Plan:   - Educate patient on Heberden nodes and their association with osteoarthritis.   - Recommend over-the-counter Tylenol for aching as needed.   - Monitor for any changes or worsening of symptoms, and consider referral to a specialist if needed.      Vitamin D deficiency    Currently on treatment.  Plan: - Continue current treatment.  - Labs today.       Relevant Orders   CBC with Differential/Platelet   Comprehensive metabolic panel   Hemoglobin A1c  Lipid panel   VITAMIN D 25 Hydroxy (Vit-D Deficiency, Fractures)   B12 and Folate Panel   Low libido    Patient reports decreased libido and difficulty with climax. Plan:   - Order lab work to check testosterone levels.   - If testosterone levels are low, consider prescribing medication to increase levels.   - Discuss potential side effects and benefits of testosterone therapy with the patient.      Relevant Orders   TestT+TestF+SHBG   Estradiol   Essential hypertension - Primary   Relevant Medications   tirzepatide (ZEPBOUND) 2.5 MG/0.5ML Pen   tirzepatide (ZEPBOUND) 5 MG/0.5ML Pen   Other Relevant Orders   CBC with Differential/Platelet   Comprehensive metabolic panel   Hemoglobin A1c   Lipid panel   Other Visit Diagnoses     Nausea       Relevant Medications   ondansetron (ZOFRAN) 4 MG tablet       Return in about 3 months (around 05/08/2023) for Weight Management (30).  Time: 42 minutes, >50% spent counseling, care coordination, chart review, and documentation.   Tara Keeler, DNP, AGNP-c 02/07/2023  9:02 AM

## 2023-02-07 NOTE — Assessment & Plan Note (Signed)
Patient reports decreased libido and difficulty with climax. Plan:   - Order lab work to check testosterone levels.   - If testosterone levels are low, consider prescribing medication to increase levels.   - Discuss potential side effects and benefits of testosterone therapy with the patient.

## 2023-02-07 NOTE — Assessment & Plan Note (Signed)
Following diet and exercise.  Plan: - labs today. - Stop phentermine - start zepbound.  - continue with diet and exercise.

## 2023-02-07 NOTE — Patient Instructions (Signed)
   Thank you for visiting the clinic today.    Below is a summary of the key points from our discussion and the instructions for your care:  - Continue to monitor the nodules on your hands; if there is any change or concern, please let us know. - We discussed potential changes in medication for weight management:   - Consider starting ZepBound (tirzepatide), with a starting dose of 2.5 mg once a week for four weeks, then potentially increasing to 5 mg.   - Prior authorization will be required for ZepBound; we will initiate this process.   - If authorization is not obtained, we will try for [facility].   - Remember to eat smaller portions, drink more water, and include protein with every meal.   - A coupon code for ZepBound is available for a three-month supply at $25. Go online to ZepBound.com to find that - We will check your testosterone levels to address concerns about libido and may prescribe medication if levels are low. - Your cholesterol levels were a bit high on the last labs; we will keep an eye on this. - No additional imaging for the nodes on your fingers is necessary unless you are under 40 or experiencing unusual weight loss. - Tylenol can be used for any aching in the hands. - Your heart sounds are regular, and your blood pressure is good. - Continue taking vitamin D once a week; your prescription is valid until September. - You are currently on metoprolol and Wellbutrin, and your Valtrex prescription has a surplus with 90 pills and three refills available.  Please feel free to reach out if you have any questions or concerns. We will follow up on the prior authorization for ZepBound and keep you updated on the process. Your proactive approach to your health is commendable, and I look forward to assisting you further on your wellness journey.

## 2023-02-11 ENCOUNTER — Other Ambulatory Visit (HOSPITAL_COMMUNITY): Payer: Self-pay

## 2023-02-13 ENCOUNTER — Encounter: Payer: Self-pay | Admitting: Nurse Practitioner

## 2023-02-16 LAB — COMPREHENSIVE METABOLIC PANEL
ALT: 19 IU/L (ref 0–32)
AST: 17 IU/L (ref 0–40)
Albumin/Globulin Ratio: 1.7 (ref 1.2–2.2)
Albumin: 3.9 g/dL (ref 3.8–4.9)
Alkaline Phosphatase: 62 IU/L (ref 44–121)
BUN/Creatinine Ratio: 13 (ref 9–23)
BUN: 13 mg/dL (ref 6–24)
Bilirubin Total: 0.3 mg/dL (ref 0.0–1.2)
CO2: 27 mmol/L (ref 20–29)
Calcium: 8.9 mg/dL (ref 8.7–10.2)
Chloride: 101 mmol/L (ref 96–106)
Creatinine, Ser: 0.97 mg/dL (ref 0.57–1.00)
Globulin, Total: 2.3 g/dL (ref 1.5–4.5)
Glucose: 97 mg/dL (ref 70–99)
Potassium: 4.3 mmol/L (ref 3.5–5.2)
Sodium: 140 mmol/L (ref 134–144)
Total Protein: 6.2 g/dL (ref 6.0–8.5)
eGFR: 68 mL/min/{1.73_m2} (ref >59)

## 2023-02-16 LAB — VITAMIN D 25 HYDROXY (VIT D DEFICIENCY, FRACTURES): Vit D, 25-Hydroxy: 26.9 ng/mL — ABNORMAL LOW (ref 30.0–100.0)

## 2023-02-16 LAB — LIPID PANEL
Chol/HDL Ratio: 3.9 ratio (ref 0.0–4.4)
Cholesterol, Total: 212 mg/dL — ABNORMAL HIGH (ref 100–199)
HDL: 55 mg/dL (ref >39)
LDL Chol Calc (NIH): 138 mg/dL — ABNORMAL HIGH (ref 0–99)
Triglycerides: 107 mg/dL (ref 0–149)
VLDL Cholesterol Cal: 19 mg/dL (ref 5–40)

## 2023-02-16 LAB — CBC WITH DIFFERENTIAL/PLATELET
Basophils Absolute: 0.1 10*3/uL (ref 0.0–0.2)
Basos: 1 %
EOS (ABSOLUTE): 0.1 10*3/uL (ref 0.0–0.4)
Eos: 2 %
Hematocrit: 42.4 % (ref 34.0–46.6)
Hemoglobin: 14.4 g/dL (ref 11.1–15.9)
Immature Grans (Abs): 0 10*3/uL (ref 0.0–0.1)
Immature Granulocytes: 0 %
Lymphocytes Absolute: 2.3 10*3/uL (ref 0.7–3.1)
Lymphs: 39 %
MCH: 31.2 pg (ref 26.6–33.0)
MCHC: 34 g/dL (ref 31.5–35.7)
MCV: 92 fL (ref 79–97)
Monocytes Absolute: 0.6 10*3/uL (ref 0.1–0.9)
Monocytes: 10 %
Neutrophils Absolute: 2.9 10*3/uL (ref 1.4–7.0)
Neutrophils: 48 %
Platelets: 210 10*3/uL (ref 150–450)
RBC: 4.61 x10E6/uL (ref 3.77–5.28)
RDW: 12.6 % (ref 11.7–15.4)
WBC: 5.9 10*3/uL (ref 3.4–10.8)

## 2023-02-16 LAB — HEMOGLOBIN A1C
Est. average glucose Bld gHb Est-mCnc: 123 mg/dL
Hgb A1c MFr Bld: 5.9 % — ABNORMAL HIGH (ref 4.8–5.6)

## 2023-02-16 LAB — B12 AND FOLATE PANEL
Folate: 5.3 ng/mL (ref >3.0)
Vitamin B-12: 383 pg/mL (ref 232–1245)

## 2023-02-16 LAB — ESTRADIOL: Estradiol: 10.1 pg/mL

## 2023-02-16 LAB — TESTT+TESTF+SHBG
Sex Hormone Binding: 22.1 nmol/L (ref 17.3–125.0)
Testosterone, Free: 0.3 pg/mL (ref 0.0–4.2)
Testosterone, Total, LC/MS: 16.9 ng/dL

## 2023-02-17 ENCOUNTER — Telehealth: Payer: Self-pay | Admitting: Nurse Practitioner

## 2023-02-17 NOTE — Telephone Encounter (Signed)
P.A. Adriana Mccallum

## 2023-02-19 ENCOUNTER — Encounter: Payer: Self-pay | Admitting: Nurse Practitioner

## 2023-02-21 NOTE — Telephone Encounter (Signed)
P.A. approved till 08/18/23, sent mychart message

## 2023-02-25 ENCOUNTER — Other Ambulatory Visit (HOSPITAL_COMMUNITY): Payer: Self-pay

## 2023-02-25 ENCOUNTER — Other Ambulatory Visit: Payer: Self-pay

## 2023-02-28 ENCOUNTER — Encounter: Payer: Self-pay | Admitting: Nurse Practitioner

## 2023-03-04 ENCOUNTER — Encounter: Payer: Self-pay | Admitting: Nurse Practitioner

## 2023-03-04 ENCOUNTER — Other Ambulatory Visit (HOSPITAL_COMMUNITY): Payer: Self-pay

## 2023-03-04 ENCOUNTER — Telehealth (INDEPENDENT_AMBULATORY_CARE_PROVIDER_SITE_OTHER): Payer: 59 | Admitting: Nurse Practitioner

## 2023-03-04 VITALS — Wt 212.0 lb

## 2023-03-04 DIAGNOSIS — Z6836 Body mass index (BMI) 36.0-36.9, adult: Secondary | ICD-10-CM

## 2023-03-04 DIAGNOSIS — R7303 Prediabetes: Secondary | ICD-10-CM

## 2023-03-04 DIAGNOSIS — I10 Essential (primary) hypertension: Secondary | ICD-10-CM

## 2023-03-04 DIAGNOSIS — R110 Nausea: Secondary | ICD-10-CM | POA: Diagnosis not present

## 2023-03-04 DIAGNOSIS — R6882 Decreased libido: Secondary | ICD-10-CM | POA: Diagnosis not present

## 2023-03-04 DIAGNOSIS — E785 Hyperlipidemia, unspecified: Secondary | ICD-10-CM

## 2023-03-04 DIAGNOSIS — E349 Endocrine disorder, unspecified: Secondary | ICD-10-CM

## 2023-03-04 DIAGNOSIS — E782 Mixed hyperlipidemia: Secondary | ICD-10-CM | POA: Diagnosis not present

## 2023-03-04 HISTORY — DX: Nausea: R11.0

## 2023-03-04 MED ORDER — ZEPBOUND 10 MG/0.5ML ~~LOC~~ SOAJ
10.0000 mg | SUBCUTANEOUS | 0 refills | Status: DC
  Filled 2023-03-04 – 2023-06-28 (×5): qty 2, 28d supply, fill #0

## 2023-03-04 MED ORDER — ZEPBOUND 7.5 MG/0.5ML ~~LOC~~ SOAJ
7.5000 mg | SUBCUTANEOUS | 0 refills | Status: DC
  Filled 2023-03-04 – 2023-04-21 (×2): qty 2, 28d supply, fill #0

## 2023-03-04 MED ORDER — ZEPBOUND 5 MG/0.5ML ~~LOC~~ SOAJ
5.0000 mg | SUBCUTANEOUS | 0 refills | Status: DC
  Filled 2023-03-04 – 2023-03-22 (×2): qty 2, 28d supply, fill #0

## 2023-03-04 NOTE — Assessment & Plan Note (Signed)
She has started on Zepbound and is tolerating the medication well without significant side effects. She plans to weigh herself today.  Plan: - Continue with current dose.  - 3 months prescriptions sent to pharmacy for pick-up - Use zofran as needed for nausea, if this occurs - increase water intake

## 2023-03-04 NOTE — Progress Notes (Signed)
Virtual Visit Encounter mychart visit.   I connected with  [name] on 03/04/23 at 11:45 AM EST by secure video and audio telemedicine application. I verified that I am speaking with the correct person using two identifiers.   I introduced myself as a Designer, jewellery with the practice. The limitations of evaluation and management by telemedicine discussed with the patient and the availability of in person appointments. The patient expressed verbal understanding and consent to proceed.  Participating parties in this visit include: Myself and patient  The patient is: Patient Location: Work I am: Provider Location: Office/Clinic Subjective:    CC and HPI: [name] is a 57 y.o. year old female presenting for follow up of weight management and low libido.  [name]'s recent labs show low free testosterone levels, which may be contributing to her decreased sexual desire. The patient has not previously received treatment for her testosterone levels but expresses a willingness to explore treatment options. She is also managed for OBGYN with Dr. Sabra Heck.  She has recently started Zepbound and has already noticed a reduction in her appetite. She is tolerating the medication well and has not had any significant side effects. She had one episode of mild nausea and took Zofran, which stopped the symptoms. She is cognizant of its potential side effect of worsening constipation.    Past medical history, Surgical history, Family history not pertinant except as noted below, Social history, Allergies, and medications have been entered into the medical record, reviewed, and corrections made.   Review of Systems:  All review of systems negative except what is listed in the HPI  Objective:    Alert and oriented x 4 Speaking in clear sentences with no shortness of breath. No distress.  Impression and Recommendations:    Problem List Items Addressed This Visit     BMI  36.0-36.9,adult    She has started on Zepbound and is tolerating the medication well without significant side effects. She plans to weigh herself today.  Plan: - Continue with current dose.  - 3 months prescriptions sent to pharmacy for pick-up - Use zofran as needed for nausea, if this occurs - increase water intake      Relevant Medications   tirzepatide (ZEPBOUND) 7.5 MG/0.5ML Pen   tirzepatide (ZEPBOUND) 10 MG/0.5ML Pen   tirzepatide (ZEPBOUND) 5 MG/0.5ML Pen   Nausea   Testosterone deficiency    Recent labs indicate free testosterone levels are on the low end, potentially contributing to decreased libido. We discussed the option of testosterone treatment and she is interested in this.  Plan: - Coordinate with Dr. Sabra Heck to explore potential testosterone treatment options and follow-up with patient.       Hyperlipidemia   Relevant Medications   tirzepatide (ZEPBOUND) 7.5 MG/0.5ML Pen   tirzepatide (ZEPBOUND) 10 MG/0.5ML Pen   tirzepatide (ZEPBOUND) 5 MG/0.5ML Pen   Pre-diabetes   Relevant Medications   tirzepatide (ZEPBOUND) 7.5 MG/0.5ML Pen   tirzepatide (ZEPBOUND) 10 MG/0.5ML Pen   tirzepatide (ZEPBOUND) 5 MG/0.5ML Pen   Low libido   Essential hypertension - Primary   Relevant Medications   tirzepatide (ZEPBOUND) 7.5 MG/0.5ML Pen   tirzepatide (ZEPBOUND) 10 MG/0.5ML Pen   tirzepatide (ZEPBOUND) 5 MG/0.5ML Pen    orders and follow up as documented in EMR I discussed the assessment and treatment plan with the patient. The patient was provided an opportunity to ask questions and all were answered. The patient agreed with the plan and demonstrated an understanding of the  instructions.   The patient was advised to call back or seek an in-person evaluation if the symptoms worsen or if the condition fails to improve as anticipated.  Follow-Up: in 3 months  I provided 16 minutes of non-face-to-face interaction with this non face-to-face encounter including intake, same-day  documentation, and chart review.   Orma Render, NP , DNP, AGNP-c [facility]

## 2023-03-04 NOTE — Assessment & Plan Note (Signed)
Recent labs indicate free testosterone levels are on the low end, potentially contributing to decreased libido. We discussed the option of testosterone treatment and she is interested in this.  Plan: - Coordinate with Dr. Sabra Heck to explore potential testosterone treatment options and follow-up with patient.

## 2023-03-08 ENCOUNTER — Other Ambulatory Visit: Payer: Self-pay | Admitting: Nurse Practitioner

## 2023-03-14 ENCOUNTER — Other Ambulatory Visit (HOSPITAL_COMMUNITY): Payer: Self-pay

## 2023-03-15 ENCOUNTER — Other Ambulatory Visit: Payer: Self-pay

## 2023-03-15 ENCOUNTER — Other Ambulatory Visit: Payer: Self-pay | Admitting: Nurse Practitioner

## 2023-03-15 ENCOUNTER — Other Ambulatory Visit (HOSPITAL_COMMUNITY): Payer: Self-pay

## 2023-03-15 MED ORDER — NONFORMULARY OR COMPOUNDED ITEM: 1 refills | Status: DC

## 2023-03-18 ENCOUNTER — Other Ambulatory Visit (HOSPITAL_COMMUNITY): Payer: Self-pay

## 2023-03-20 ENCOUNTER — Other Ambulatory Visit (HOSPITAL_COMMUNITY): Payer: Self-pay

## 2023-03-22 ENCOUNTER — Other Ambulatory Visit (HOSPITAL_COMMUNITY): Payer: Self-pay

## 2023-03-26 ENCOUNTER — Encounter: Payer: Self-pay | Admitting: Nurse Practitioner

## 2023-03-26 ENCOUNTER — Other Ambulatory Visit (HOSPITAL_COMMUNITY): Payer: Self-pay

## 2023-03-26 ENCOUNTER — Other Ambulatory Visit: Payer: Self-pay | Admitting: Nurse Practitioner

## 2023-03-26 DIAGNOSIS — R110 Nausea: Secondary | ICD-10-CM

## 2023-03-26 MED ORDER — ONDANSETRON HCL 4 MG PO TABS
4.0000 mg | ORAL_TABLET | Freq: Three times a day (TID) | ORAL | 2 refills | Status: DC | PRN
  Filled 2023-03-26: qty 30, 10d supply, fill #0
  Filled 2023-04-21 – 2023-05-23 (×2): qty 30, 10d supply, fill #1

## 2023-03-26 MED ORDER — VITAMIN B-12 100 MCG PO TABS
100.0000 ug | ORAL_TABLET | Freq: Every day | ORAL | 1 refills | Status: DC
  Filled 2023-03-26 – 2023-06-27 (×2): qty 90, 90d supply, fill #0
  Filled 2023-10-16: qty 90, 90d supply, fill #1

## 2023-03-27 ENCOUNTER — Other Ambulatory Visit (HOSPITAL_COMMUNITY): Payer: Self-pay

## 2023-04-22 ENCOUNTER — Other Ambulatory Visit (HOSPITAL_COMMUNITY): Payer: Self-pay

## 2023-04-24 ENCOUNTER — Other Ambulatory Visit (HOSPITAL_COMMUNITY): Payer: Self-pay

## 2023-04-25 ENCOUNTER — Other Ambulatory Visit: Payer: Self-pay

## 2023-04-26 ENCOUNTER — Other Ambulatory Visit: Payer: Self-pay

## 2023-04-30 ENCOUNTER — Other Ambulatory Visit (HOSPITAL_COMMUNITY): Payer: Self-pay

## 2023-05-01 ENCOUNTER — Other Ambulatory Visit (HOSPITAL_COMMUNITY): Payer: Self-pay

## 2023-05-03 ENCOUNTER — Other Ambulatory Visit (HOSPITAL_COMMUNITY): Payer: Self-pay

## 2023-05-09 ENCOUNTER — Encounter: Payer: Self-pay | Admitting: Nurse Practitioner

## 2023-05-09 ENCOUNTER — Other Ambulatory Visit (HOSPITAL_COMMUNITY): Payer: Self-pay

## 2023-05-09 ENCOUNTER — Ambulatory Visit (INDEPENDENT_AMBULATORY_CARE_PROVIDER_SITE_OTHER): Payer: 59 | Admitting: Nurse Practitioner

## 2023-05-09 VITALS — BP 128/80 | HR 100 | Wt 204.8 lb

## 2023-05-09 DIAGNOSIS — M151 Heberden's nodes (with arthropathy): Secondary | ICD-10-CM | POA: Diagnosis not present

## 2023-05-09 DIAGNOSIS — Z79890 Hormone replacement therapy (postmenopausal): Secondary | ICD-10-CM

## 2023-05-09 DIAGNOSIS — Z6836 Body mass index (BMI) 36.0-36.9, adult: Secondary | ICD-10-CM | POA: Diagnosis not present

## 2023-05-09 DIAGNOSIS — E349 Endocrine disorder, unspecified: Secondary | ICD-10-CM

## 2023-05-09 LAB — CBC WITH DIFFERENTIAL/PLATELET
Basophils Absolute: 0.1 10*3/uL (ref 0.0–0.2)
Basos: 1 %
EOS (ABSOLUTE): 0.1 10*3/uL (ref 0.0–0.4)
Eos: 2 %
Hematocrit: 43.3 % (ref 34.0–46.6)
Hemoglobin: 14.4 g/dL (ref 11.1–15.9)
Immature Grans (Abs): 0 10*3/uL (ref 0.0–0.1)
Immature Granulocytes: 0 %
Lymphocytes Absolute: 2.2 10*3/uL (ref 0.7–3.1)
Lymphs: 33 %
MCH: 31.2 pg (ref 26.6–33.0)
MCHC: 33.3 g/dL (ref 31.5–35.7)
MCV: 94 fL (ref 79–97)
Monocytes Absolute: 0.6 10*3/uL (ref 0.1–0.9)
Monocytes: 9 %
Neutrophils Absolute: 3.8 10*3/uL (ref 1.4–7.0)
Neutrophils: 55 %
Platelets: 205 10*3/uL (ref 150–450)
RBC: 4.61 x10E6/uL (ref 3.77–5.28)
RDW: 13.3 % (ref 11.7–15.4)
WBC: 6.8 10*3/uL (ref 3.4–10.8)

## 2023-05-09 MED ORDER — PHENTERMINE HCL 37.5 MG PO TABS
37.5000 mg | ORAL_TABLET | Freq: Every day | ORAL | 0 refills | Status: DC
  Filled 2023-05-09: qty 90, 90d supply, fill #0

## 2023-05-09 NOTE — Assessment & Plan Note (Signed)
Improvement in size of Heberden's nodes with the use of apple cider vinegar supplementation.  No side effects reported.  Discussed with patient that she may continue this supplement if she is finding it beneficial for her health as it does not appear to have any negative side effects at this time.  We will continue to monitor.

## 2023-05-09 NOTE — Assessment & Plan Note (Signed)
[name] has been working on [employer] with diet and exercise and recently started use of stepdown with a significant improvement in her weight.  Unfortunately due to insurance denial and supply issues she has been out of the medication now for approximately 2 weeks.  We discussed the option of restarting the medication at the lower dose or waiting to see if this supply would improve.  At this time I am concerned that if we restart the medication we may continue to follow a cycle of inability to get the higher doses which would lead to ineffective treatment and significant cost burden for her without benefit.  She has tried phentermine in the past which was successful for her.  We can restart that at this time and see how she does as she is actively working for lifestyle changes.  Prescription sent for 3 months.  We will plan to follow-up in about 6 months to see how she is doing unless she has any concerns sooner.

## 2023-05-09 NOTE — Progress Notes (Addendum)
Tara Clamp, DNP, AGNP-c [facility]  [address] [place], [place] [address] [phone]  ESTABLISHED PATIENT- Chronic Health and/or Follow-Up Visit  Blood pressure 128/80, pulse 100, weight 204 lb 12.8 oz (92.9 kg).    [name] is a 57 y.o. year old female presenting today for evaluation and management of chronic conditions.   [name] has not been able to take her Zepbound for 2 weeks at this point due to the pharmacy being out of the 7.5mg  dosing. She was planning to purchase this at the discounted rate on her own given that the insurance company is no longer covering this, but now she is not sure what she should do. We discussed the supply issues and complications with restarting the medication at the higher doses after she has been of for [redacted] weeks along with alternative options for management of weight.   [name] is doing well on her testosterone replacement. She has been applying the dose to her thighs 3 days a week and reports increased libido and decreased discomfort during intercourse. She is not having any negative side effects from the medication and would like to continue.   She also mentions a small skin change on the right side of the bridge of her nose that has been present for the past few weeks. She is not sure if this should be evaluated with dermatology. It is not painful or draining. She is not sure if this is due to her glasses or other concern.   She has been taking apple cider vinegar supplements and feels her Heberden nodes on the right ring finger has improved.   All ROS negative with exception of what is listed above.   PHYSICAL EXAM Physical Exam Vitals and nursing note reviewed.  Constitutional:      Appearance: Normal appearance. She is obese.  HENT:     Head: Normocephalic.  Eyes:     Pupils: Pupils are equal, round, and reactive to light.  Cardiovascular:     Rate and Rhythm: Normal rate and regular rhythm.      Pulses: Normal pulses.     Heart sounds: Normal heart sounds.  Pulmonary:     Effort: Pulmonary effort is normal.     Breath sounds: Normal breath sounds.  Musculoskeletal:     Cervical back: Normal range of motion.     Right lower leg: No edema.     Left lower leg: No edema.  Skin:    General: Skin is warm and dry.     Capillary Refill: Capillary refill takes less than 2 seconds.     Findings: Lesion present. No erythema.       Neurological:     General: No focal deficit present.     Mental Status: She is alert and oriented to person, place, and time.  Psychiatric:        Mood and Affect: Mood normal.      PLAN Problem List Items Addressed This Visit     BMI 36.0-36.9,adult - Primary    [name] has been working on [employer] with diet and exercise and recently started use of stepdown with a significant improvement in her weight.  Unfortunately due to insurance denial and supply issues she has been out of the medication now for approximately 2 weeks.  We discussed the option of restarting the medication at the lower dose or waiting to see if this supply would improve.  At this time I am concerned that if we restart the medication  we may continue to follow a cycle of inability to get the higher doses which would lead to ineffective treatment and significant cost burden for her without benefit.  She has tried phentermine in the past which was successful for her.  We can restart that at this time and see how she does as she is actively working for lifestyle changes.  Prescription sent for 3 months.  We will plan to follow-up in about 6 months to see how she is doing unless she has any concerns sooner.      Relevant Medications   phentermine (ADIPEX-P) 37.5 MG tablet   Hormone replacement therapy (postmenopausal)    Recent start of topical testosterone therapy for documented low testosterone levels, low libido, and mood changes associated with menopause.  Since starting the  medication 3 months ago she reports she is doing very well and has had a significant improvement in her symptoms.  We will obtain repeat labs today to ensure that the testosterone levels are appropriate and refill the treatment based on any need for adjustment of therapy.  At this time no side effects or alarm symptoms are present.  She is aware of symptoms that would warrant immediate evaluation and stopping the medication.  Will plan to follow-up based upon lab results.      Relevant Orders   Testosterone, Free, Total, SHBG   CBC with Differential/Platelet   Heberden's nodes of both hands    Improvement in size of Heberden's nodes with the use of apple cider vinegar supplementation.  No side effects reported.  Discussed with patient that she may continue this supplement if she is finding it beneficial for her health as it does not appear to have any negative side effects at this time.  We will continue to monitor.      Testosterone deficiency     Return in about 6 months (around 11/09/2023).   Tara Clamp, DNP, AGNP-c 05/09/2023  9:12 AM

## 2023-05-09 NOTE — Assessment & Plan Note (Signed)
Recent start of topical testosterone therapy for documented low testosterone levels, low libido, and mood changes associated with menopause.  Since starting the medication 3 months ago she reports she is doing very well and has had a significant improvement in her symptoms.  We will obtain repeat labs today to ensure that the testosterone levels are appropriate and refill the treatment based on any need for adjustment of therapy.  At this time no side effects or alarm symptoms are present.  She is aware of symptoms that would warrant immediate evaluation and stopping the medication.  Will plan to follow-up based upon lab results.

## 2023-05-09 NOTE — Patient Instructions (Signed)
We will try the phentermine and see how that does for you. Hopefully the supply will improve and we can restart at some point and maybe insurance will cover at that point! :-)

## 2023-05-14 LAB — TESTOSTERONE, FREE, TOTAL, SHBG
Sex Hormone Binding: 23.3 nmol/L (ref 17.3–125.0)
Testosterone, Free: 0.6 pg/mL (ref 0.0–4.2)
Testosterone: 10 ng/dL (ref 4–50)

## 2023-05-19 ENCOUNTER — Encounter: Payer: Self-pay | Admitting: Nurse Practitioner

## 2023-05-20 ENCOUNTER — Other Ambulatory Visit (HOSPITAL_COMMUNITY): Payer: Self-pay

## 2023-05-20 ENCOUNTER — Other Ambulatory Visit: Payer: Self-pay

## 2023-05-20 DIAGNOSIS — B009 Herpesviral infection, unspecified: Secondary | ICD-10-CM

## 2023-05-20 MED ORDER — VALACYCLOVIR HCL 500 MG PO TABS
500.0000 mg | ORAL_TABLET | Freq: Two times a day (BID) | ORAL | 1 refills | Status: DC
  Filled 2023-05-20: qty 90, 45d supply, fill #0
  Filled 2023-08-29: qty 90, 45d supply, fill #1

## 2023-05-23 ENCOUNTER — Other Ambulatory Visit (HOSPITAL_COMMUNITY): Payer: Self-pay

## 2023-05-29 ENCOUNTER — Encounter: Payer: Self-pay | Admitting: Nurse Practitioner

## 2023-06-03 ENCOUNTER — Other Ambulatory Visit (HOSPITAL_COMMUNITY): Payer: Self-pay

## 2023-06-15 ENCOUNTER — Encounter (HOSPITAL_COMMUNITY): Payer: Self-pay

## 2023-06-15 ENCOUNTER — Other Ambulatory Visit (HOSPITAL_COMMUNITY): Payer: Self-pay

## 2023-06-18 ENCOUNTER — Other Ambulatory Visit (HOSPITAL_COMMUNITY): Payer: Self-pay

## 2023-06-20 ENCOUNTER — Other Ambulatory Visit: Payer: Self-pay

## 2023-06-24 ENCOUNTER — Other Ambulatory Visit (HOSPITAL_COMMUNITY): Payer: Self-pay

## 2023-06-28 ENCOUNTER — Other Ambulatory Visit: Payer: Self-pay

## 2023-06-28 ENCOUNTER — Other Ambulatory Visit (HOSPITAL_COMMUNITY): Payer: Self-pay

## 2023-07-05 ENCOUNTER — Other Ambulatory Visit (HOSPITAL_COMMUNITY): Payer: Self-pay

## 2023-07-05 ENCOUNTER — Other Ambulatory Visit: Payer: Self-pay | Admitting: Nurse Practitioner

## 2023-07-05 ENCOUNTER — Encounter: Payer: Self-pay | Admitting: Nurse Practitioner

## 2023-07-05 DIAGNOSIS — R110 Nausea: Secondary | ICD-10-CM

## 2023-07-05 MED ORDER — ONDANSETRON 4 MG PO TBDP
4.0000 mg | ORAL_TABLET | Freq: Three times a day (TID) | ORAL | 0 refills | Status: DC | PRN
  Filled 2023-07-05 (×2): qty 20, 7d supply, fill #0

## 2023-07-13 ENCOUNTER — Other Ambulatory Visit (HOSPITAL_COMMUNITY): Payer: Self-pay

## 2023-07-16 ENCOUNTER — Encounter: Payer: Self-pay | Admitting: Nurse Practitioner

## 2023-07-17 ENCOUNTER — Other Ambulatory Visit: Payer: Self-pay | Admitting: Medical

## 2023-07-17 ENCOUNTER — Other Ambulatory Visit (HOSPITAL_COMMUNITY): Payer: Self-pay

## 2023-07-17 MED ORDER — ZEPBOUND 15 MG/0.5ML ~~LOC~~ SOAJ
15.0000 mg | SUBCUTANEOUS | 0 refills | Status: DC
  Filled 2023-07-17: qty 2, 28d supply, fill #0

## 2023-07-22 ENCOUNTER — Other Ambulatory Visit (HOSPITAL_COMMUNITY): Payer: Self-pay

## 2023-07-28 ENCOUNTER — Other Ambulatory Visit: Payer: Self-pay | Admitting: Nurse Practitioner

## 2023-07-28 DIAGNOSIS — R110 Nausea: Secondary | ICD-10-CM

## 2023-07-29 ENCOUNTER — Other Ambulatory Visit (HOSPITAL_COMMUNITY): Payer: Self-pay

## 2023-07-29 ENCOUNTER — Other Ambulatory Visit: Payer: Self-pay

## 2023-07-29 MED ORDER — ONDANSETRON 4 MG PO TBDP
4.0000 mg | ORAL_TABLET | Freq: Three times a day (TID) | ORAL | 0 refills | Status: DC | PRN
  Filled 2023-07-29: qty 12, 4d supply, fill #0
  Filled 2023-07-29: qty 8, 3d supply, fill #0
  Filled 2023-08-01: qty 20, 7d supply, fill #0

## 2023-08-01 ENCOUNTER — Other Ambulatory Visit (HOSPITAL_COMMUNITY): Payer: Self-pay

## 2023-08-15 ENCOUNTER — Telehealth: Payer: 59 | Admitting: Nurse Practitioner

## 2023-08-15 ENCOUNTER — Other Ambulatory Visit (HOSPITAL_COMMUNITY): Payer: Self-pay

## 2023-08-15 ENCOUNTER — Encounter: Payer: Self-pay | Admitting: Nurse Practitioner

## 2023-08-15 VITALS — Wt 178.0 lb

## 2023-08-15 DIAGNOSIS — H1032 Unspecified acute conjunctivitis, left eye: Secondary | ICD-10-CM | POA: Diagnosis not present

## 2023-08-15 DIAGNOSIS — Z6836 Body mass index (BMI) 36.0-36.9, adult: Secondary | ICD-10-CM

## 2023-08-15 DIAGNOSIS — E559 Vitamin D deficiency, unspecified: Secondary | ICD-10-CM

## 2023-08-15 DIAGNOSIS — E785 Hyperlipidemia, unspecified: Secondary | ICD-10-CM | POA: Diagnosis not present

## 2023-08-15 DIAGNOSIS — R7303 Prediabetes: Secondary | ICD-10-CM | POA: Diagnosis not present

## 2023-08-15 DIAGNOSIS — I10 Essential (primary) hypertension: Secondary | ICD-10-CM | POA: Diagnosis not present

## 2023-08-15 DIAGNOSIS — E782 Mixed hyperlipidemia: Secondary | ICD-10-CM

## 2023-08-15 MED ORDER — ZEPBOUND 15 MG/0.5ML ~~LOC~~ SOAJ
15.0000 mg | SUBCUTANEOUS | 6 refills | Status: DC
Start: 2023-08-15 — End: 2024-03-02
  Filled 2023-08-15: qty 2, 28d supply, fill #0
  Filled 2023-09-12: qty 2, 28d supply, fill #1
  Filled 2023-10-16: qty 2, 28d supply, fill #2
  Filled 2023-11-12: qty 2, 28d supply, fill #3
  Filled 2023-12-10: qty 2, 28d supply, fill #4
  Filled 2024-01-07: qty 2, 28d supply, fill #5
  Filled 2024-01-24 – 2024-02-02 (×3): qty 2, 28d supply, fill #6

## 2023-08-15 MED ORDER — ERYTHROMYCIN 5 MG/GM OP OINT
1.0000 | TOPICAL_OINTMENT | Freq: Every day | OPHTHALMIC | 0 refills | Status: AC
Start: 2023-08-15 — End: ?
  Filled 2023-08-15: qty 3.5, 10d supply, fill #0

## 2023-08-15 MED ORDER — VITAMIN D (ERGOCALCIFEROL) 1.25 MG (50000 UNIT) PO CAPS
50000.0000 [IU] | ORAL_CAPSULE | ORAL | 3 refills | Status: DC
  Filled 2023-08-15: qty 12, 84d supply, fill #0
  Filled 2023-11-06: qty 12, 84d supply, fill #1

## 2023-08-15 NOTE — Progress Notes (Signed)
Virtual Visit Encounter mychart visit.   I connected with  [name] on 08/27/23 at  1:30 PM EDT by secure video and audio telemedicine application. I verified that I am speaking with the correct person using two identifiers.   I introduced myself as a Publishing rights manager with the practice. The limitations of evaluation and management by telemedicine discussed with the patient and the availability of in person appointments. The patient expressed verbal understanding and consent to proceed.  Participating parties in this visit include: Myself and patient  The patient is: Patient Location: Home I am: Provider Location: Office/Clinic Subjective:    CC and HPI: [name] is a 57 y.o. year old female presenting for follow up of weight . Patient reports the following:  [name] tells me that the weight management is going great. She tells me she she has changed her diet and is working on increased activity. She reports she is very pleased with the progress. She is down 44 lb so far. She tells me she feels good. 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 She would like to continue at this time.  She does report itching at the injection site, but this goes away in a few days with no further issues.   She also reports drainage and itching in her left eye with the eye matted shut in the mornings. She has concerns for conjunctivitis.   Past medical history, Surgical history, Family history not pertinant except as noted below, Social history, Allergies, and medications have been entered into the medical record, reviewed, and corrections made.   Review of Systems:  All review of systems negative except what is listed in the HPI  Objective:    Alert and oriented x 4 Speaking in clear sentences with no shortness of breath. No distress.  Impression and Recommendations:    Problem List Items  Addressed This Visit     Essential hypertension    Chronic. Currently managed with metoprolol and weight loss efforts with zepbound. No alarm symptoms present. Will continue current therapy.      Hyperlipidemia    Chronic. Currently managed with diet, exercise, and weight management with zepbound. Will plan for labs in the next visit. No concerning symptoms.       BMI 36.0-36.9,adult - Primary    Chronic with difficulty managing weight due to sugar cravings. She has been successful in losing 44lbs and has been able to reduce her sugar intake and improve her lifestyle with the help of zepbound. At this time we will plan to continue her current regimen and monitor closely.       Relevant Medications   tirzepatide (ZEPBOUND) 15 MG/0.5ML Pen   Pre-diabetes    Chronic. Weight management and diet management in progress. No alarm symptoms.       Vitamin D deficiency   Relevant Medications   Vitamin D, Ergocalciferol, (DRISDOL) 1.25 MG (50000 UNIT) CAPS capsule   Other Visit Diagnoses     Acute bacterial conjunctivitis of left eye       Relevant Medications   erythromycin ophthalmic ointment       current treatment plan is effective, no change in therapy I discussed the assessment and treatment plan with the patient. The patient was provided an opportunity to ask questions and all were answered. The patient agreed with the plan and demonstrated an understanding of the instructions.   The patient  was advised to call back or seek an in-person evaluation if the symptoms worsen or if the condition fails to improve as anticipated.  Follow-Up: in 6 months  I provided 17 minutes of non-face-to-face interaction with this non face-to-face encounter including intake, same-day documentation, and chart review.   Tollie Eth, NP , DNP, AGNP-c [facility]

## 2023-08-16 ENCOUNTER — Other Ambulatory Visit (HOSPITAL_COMMUNITY): Payer: Self-pay

## 2023-08-24 ENCOUNTER — Telehealth: Payer: Self-pay | Admitting: Nurse Practitioner

## 2023-08-24 NOTE — Telephone Encounter (Signed)
(  Key: [id number] Need Help? Call us at [phone] Outcome Additional Information Required This drug/product is not covered under the pharmacy benefit. Prior Authorization is not available. Drug Zepbound 2.5MG /0.5ML pen-injectors ePA cloud logo Form [employer] ePA Form  [facility] is no longer covered weight loss medications. Sent mychart message

## 2023-08-27 DIAGNOSIS — H524 Presbyopia: Secondary | ICD-10-CM | POA: Diagnosis not present

## 2023-08-27 NOTE — Assessment & Plan Note (Signed)
Chronic with difficulty managing weight due to sugar cravings. She has been successful in losing 44lbs and has been able to reduce her sugar intake and improve her lifestyle with the help of zepbound. At this time we will plan to continue her current regimen and monitor closely.

## 2023-08-27 NOTE — Assessment & Plan Note (Signed)
Chronic. Currently managed with metoprolol and weight loss efforts with zepbound. No alarm symptoms present. Will continue current therapy.

## 2023-08-27 NOTE — Assessment & Plan Note (Signed)
Chronic. Weight management and diet management in progress. No alarm symptoms.

## 2023-08-27 NOTE — Assessment & Plan Note (Signed)
Chronic. Currently managed with diet, exercise, and weight management with zepbound. Will plan for labs in the next visit. No concerning symptoms.

## 2023-08-29 ENCOUNTER — Other Ambulatory Visit (HOSPITAL_COMMUNITY): Payer: Self-pay

## 2023-08-29 ENCOUNTER — Other Ambulatory Visit: Payer: Self-pay

## 2023-08-29 DIAGNOSIS — L409 Psoriasis, unspecified: Secondary | ICD-10-CM | POA: Diagnosis not present

## 2023-08-29 DIAGNOSIS — L814 Other melanin hyperpigmentation: Secondary | ICD-10-CM | POA: Diagnosis not present

## 2023-08-29 DIAGNOSIS — Z86018 Personal history of other benign neoplasm: Secondary | ICD-10-CM | POA: Diagnosis not present

## 2023-08-29 DIAGNOSIS — R234 Changes in skin texture: Secondary | ICD-10-CM | POA: Diagnosis not present

## 2023-08-29 DIAGNOSIS — L578 Other skin changes due to chronic exposure to nonionizing radiation: Secondary | ICD-10-CM | POA: Diagnosis not present

## 2023-08-29 DIAGNOSIS — L820 Inflamed seborrheic keratosis: Secondary | ICD-10-CM | POA: Diagnosis not present

## 2023-08-29 DIAGNOSIS — D225 Melanocytic nevi of trunk: Secondary | ICD-10-CM | POA: Diagnosis not present

## 2023-08-29 DIAGNOSIS — D1801 Hemangioma of skin and subcutaneous tissue: Secondary | ICD-10-CM | POA: Diagnosis not present

## 2023-08-29 DIAGNOSIS — L821 Other seborrheic keratosis: Secondary | ICD-10-CM | POA: Diagnosis not present

## 2023-08-29 DIAGNOSIS — D485 Neoplasm of uncertain behavior of skin: Secondary | ICD-10-CM | POA: Diagnosis not present

## 2023-08-29 MED ORDER — MOMETASONE FUROATE 0.1 % EX SOLN
1.0000 | Freq: Two times a day (BID) | CUTANEOUS | 1 refills | Status: DC
  Filled 2023-08-29: qty 30, 30d supply, fill #0

## 2023-08-29 MED ORDER — TRIAMCINOLONE ACETONIDE 0.1 % EX OINT
1.0000 | TOPICAL_OINTMENT | Freq: Two times a day (BID) | CUTANEOUS | 0 refills | Status: DC
  Filled 2023-08-29: qty 15, 30d supply, fill #0

## 2023-09-12 ENCOUNTER — Other Ambulatory Visit (HOSPITAL_COMMUNITY): Payer: Self-pay

## 2023-09-17 ENCOUNTER — Encounter: Payer: Self-pay | Admitting: Nurse Practitioner

## 2023-09-17 ENCOUNTER — Other Ambulatory Visit: Payer: Self-pay | Admitting: Nurse Practitioner

## 2023-09-17 ENCOUNTER — Other Ambulatory Visit (HOSPITAL_COMMUNITY): Payer: Self-pay

## 2023-09-17 DIAGNOSIS — Z79890 Hormone replacement therapy (postmenopausal): Secondary | ICD-10-CM

## 2023-09-17 DIAGNOSIS — R110 Nausea: Secondary | ICD-10-CM

## 2023-09-17 MED ORDER — ONDANSETRON 4 MG PO TBDP
4.0000 mg | ORAL_TABLET | Freq: Three times a day (TID) | ORAL | 0 refills | Status: DC | PRN
  Filled 2023-09-17: qty 20, 7d supply, fill #0

## 2023-09-23 ENCOUNTER — Telehealth: Payer: Self-pay | Admitting: Internal Medicine

## 2023-09-23 NOTE — Telephone Encounter (Signed)
Order Details  Medication NDC Prescription # DAW  TESTOSTERONE AVB ( ) 1% (10MG /ML) CREAM 1% (10MG /ML) CREAM Supply [id number] No  Written Date Last Fill Date Quantity Days Supply  03/15/2023 05/20/2023 4 each 90  Sig Notes  Apply 0.43ml topically three times a week to thigh. Not Available   Pharmacy Information  Pharmacy NCPDP ID Address Phone  [facility] [phone] [address] [place] [address] [phone] [facility])

## 2023-09-26 MED ORDER — NONFORMULARY OR COMPOUNDED ITEM
1 refills | Status: AC
Start: 2023-09-26 — End: ?

## 2023-10-01 ENCOUNTER — Encounter: Payer: Self-pay | Admitting: Nurse Practitioner

## 2023-10-02 ENCOUNTER — Other Ambulatory Visit (HOSPITAL_COMMUNITY): Payer: Self-pay

## 2023-10-02 ENCOUNTER — Telehealth: Payer: 59 | Admitting: Medical

## 2023-10-02 VITALS — Wt 172.0 lb

## 2023-10-02 DIAGNOSIS — R6883 Chills (without fever): Secondary | ICD-10-CM

## 2023-10-02 DIAGNOSIS — R051 Acute cough: Secondary | ICD-10-CM | POA: Diagnosis not present

## 2023-10-02 DIAGNOSIS — U071 COVID-19: Secondary | ICD-10-CM | POA: Diagnosis not present

## 2023-10-02 MED ORDER — PAXLOVID (300/100) 20 X 150 MG & 10 X 100MG PO TBPK
1.0000 | ORAL_TABLET | Freq: Two times a day (BID) | ORAL | 0 refills | Status: AC
  Filled 2023-10-02: qty 30, 5d supply, fill #0

## 2023-10-02 NOTE — Progress Notes (Signed)
Subjective:     Patient ID: [name], female   DOB: 05-23-1966, 57 y.o.   MRN: [id number]  This visit type was conducted due to national recommendations for restrictions regarding the COVID-19 Pandemic (e.g. social distancing) in an effort to limit this patient's exposure and mitigate transmission in our community.  Due to their co-morbid illnesses, this patient is at least at moderate risk for complications without adequate follow up.  This format is felt to be most appropriate for this patient at this time.    Documentation for virtual audio and video telecommunications through [place] encounter:  The patient was located at home. The provider was located in the office. The patient did consent to this visit and is aware of possible charges through their insurance for this visit.  The other persons participating in this telemedicine service were none. Time spent on call was 20 minutes and in review of previous records 20 minutes total.  This virtual service is not related to other E/M service within previous 7 days.   HPI Chief Complaint  Patient presents with   Covid Positive    Positive covid, symptoms- Monday- cough,sore throat and chills. Tested positive yesterday   Virtual consult for covid infection.  Symptoms began 2 days ago.  Has cough, sore throat, chills.  Having some sneezing, post nasal drainage.  Stuffy nose.  No NVD.  No SOB or wheezing.  Using advil cold and sinus, tylenol.  Has had some bad headache.  Energy is decreased.  No sick contacts.    Has had covid prior.  Was on paxlovid that time and it did help then.  Did not get severe illness last time.  So far symptoms with this infection are mild  Water intake is improved.   Cough right now is mild.  Did Nyquil yesterday.  No other aggravating or relieving factors. No other complaint.   Past Medical History:  Diagnosis Date   Dizziness 10/21/2015   Essential hypertension 10/21/2015   HSV  (herpes simplex virus) infection    Hyperlipidemia 10/21/2015   Kidney calculi    Current Outpatient Medications on File Prior to Visit  Medication Sig Dispense Refill   buPROPion (WELLBUTRIN XL) 300 MG 24 hr tablet Take 1 tablet (300 mg total) by mouth daily. 90 tablet 3   fish oil-omega-3 fatty acids 1000 MG capsule Take 2 g by mouth daily.     fluticasone (FLONASE) 50 MCG/ACT nasal spray Place 2 sprays into both nostrils daily. 16 g 0   Melatonin-Pyridoxine (MELATIN PO) Take 1 tablet by mouth at bedtime as needed. For sleep     metoprolol tartrate (LOPRESSOR) 25 MG tablet Take 1 tablet (25 mg total) by mouth 2 (two) times daily. 180 tablet 3   mometasone (ELOCON) 0.1 % lotion Apply topically 2 (two) times daily for 14 days. 30 mL 1   ondansetron (ZOFRAN-ODT) 4 MG disintegrating tablet Dissolve 1 tablet (4 mg total) by mouth every 8 (eight) hours as needed for nausea or vomiting. 20 tablet 0   tirzepatide (ZEPBOUND) 15 MG/0.5ML Pen Inject 15 mg into the skin once a week. 2 mL 6   valACYclovir (VALTREX) 500 MG tablet Take 1 tablet (500 mg) by mouth 2 times daily as needed for 3 days 90 tablet 1   vitamin B-12 (CYANOCOBALAMIN) 100 MCG tablet Take 1 tablet (100 mcg total) by mouth daily. 90 tablet 1   Vitamin D, Ergocalciferol, (DRISDOL) 1.25 MG (50000 UNIT) CAPS capsule Take 1 capsule (50,000 Units  total) by mouth every 7 (seven) days. 12 capsule 3   NONFORMULARY OR COMPOUNDED ITEM Topical testosterone cream 1mg /0.65ml.  Apply topically 0.81ml topically three times weekly to thigh.  Disp: 3 month supply.  #1RF. (Patient not taking: Reported on 10/02/2023) 1 each 1   No current facility-administered medications on file prior to visit.    Review of Systems As in subjective    Objective:   Physical Exam Due to coronavirus pandemic stay at home measures, patient visit was virtual and they were not examined in person.   Wt 172 lb (78 kg)   BMI 28.62 kg/m   Gen: wd, wn, nad No labored  breathing or wheezing      Assessment:     Encounter Diagnoses  Name Primary?   COVID Yes   Acute cough    Chill        Plan:      Covid infection, covid illness general recommendations:  I recommend you rest, hydrate well with water and clear fluids throughout the day.  If you feel dry in the mouth, tongue or feel that you are urinating as much as usual, then increase hydration.  You urine should be like yellow to clear, not dark yellow or darker.     Pain, body aches, or fever: You can use Tylenol /Acetaminophen 325mg  over the counter for pain or fever, every 4-6 hours OR You can use Ibuprofen 200mg  over the counter, 3 tablets either 2 or 3 times daily for pain or fever.     Cough: You can use over the counter Nyquil that you are using for cough.   Drainage and congestion: You can use over the counter antihistamine such as zyrtec, allegra, or benadryl as directed on the label OR You can use Mucinex/Guaifenesin as directed on the label   Nausea: You can use over the counter Emetrol for nausea.     Antiviral medication: You can consider using medication Paxlovid to help reduce your risk of hospitalization or severe illness.  If medicaiton is not available, too expensive or not covered by insurance, then call us back.   Other supportive measures: I recommend extra vitamins to help your body fight the illness.   Consider over the counter vitamin pack such as EmergenC Immune Plus which contains extra vitamin C, vitamin D and zinc.     In the next few days, if you are having trouble breathing, if you are very weak, have high fever 103 or higher consistently despite Tylenol, or uncontrollable nausea and vomiting, then call or go to the emergency department.    If you have other questions or have other symptoms or questions you are concerned about then please make a virtual visit   Covid symptoms such as fatigue and cough can linger over 2 weeks, even after the  initial fever, aches, chills, and other initial symptoms.   Self Quarantine: The CDC, Centers for Disease Control has recommended a self quarantine of 5 days from the start of your illness until you are symptom-free including at least 24 hours of no symptoms including no fever, no shortness of breath, and no body aches and chills, by day 5 before returning to work or general contact with the public.  What does self quarantine mean: avoiding contact with people as much as possible.   Particularly in your house, isolate your self from others in a separate room, wear a mask when possible in the room, particularly if coughing a lot.   Have others  bring food, water, medications, etc., to your door, but avoid direct contact with your household contacts during this time to avoid spreading the infection to them.   If you have a separate bathroom and living quarters during the next 2 weeks away from others, that would be preferable.    If you can't completely isolate, then wear a mask, wash hands frequently with soap and water for at least 15 seconds, minimize close contact with others, and have a friend or family member check regularly from a distance to make sure you are not getting seriously worse.     You should not be going out in public, should not be going to stores, to work or other public places until all your symptoms have resolved and at least 5 days + 24 hours of no symptoms at all have transpired.   Ideally you should avoid contact with others for a full 5 days if possible.  One of the goals is to limit spread to high risk people; people that are older and elderly, people with multiple health issues like diabetes, heart disease, lung disease, and anybody that has weakened immune systems such as people with cancer or on immunosuppressive therapy.     [name] was seen today for covid positive.  Diagnoses and all orders for this visit:  COVID  Acute cough  Chill  Other orders -      nirmatrelvir & ritonavir (PAXLOVID, 300/100,) 20 x 150 MG & 10 x 100MG  TBPK; Take 1 tablet by mouth in the morning and at bedtime for 5 days.  F/u prn

## 2023-10-08 ENCOUNTER — Other Ambulatory Visit (HOSPITAL_COMMUNITY): Payer: Self-pay

## 2023-10-11 ENCOUNTER — Other Ambulatory Visit (HOSPITAL_COMMUNITY): Payer: Self-pay

## 2023-10-16 ENCOUNTER — Other Ambulatory Visit (HOSPITAL_COMMUNITY): Payer: Self-pay

## 2023-10-16 ENCOUNTER — Other Ambulatory Visit: Payer: Self-pay

## 2023-10-17 ENCOUNTER — Other Ambulatory Visit (HOSPITAL_COMMUNITY): Payer: Self-pay

## 2023-10-18 ENCOUNTER — Other Ambulatory Visit (HOSPITAL_COMMUNITY): Payer: Self-pay

## 2023-11-13 ENCOUNTER — Other Ambulatory Visit (HOSPITAL_COMMUNITY): Payer: Self-pay

## 2023-11-14 ENCOUNTER — Other Ambulatory Visit (HOSPITAL_COMMUNITY): Payer: Self-pay

## 2023-11-14 ENCOUNTER — Encounter: Payer: Self-pay | Admitting: Nurse Practitioner

## 2023-11-14 ENCOUNTER — Ambulatory Visit: Payer: 59 | Admitting: Nurse Practitioner

## 2023-11-14 VITALS — BP 126/82 | HR 64 | Ht 65.0 in | Wt 167.0 lb

## 2023-11-14 DIAGNOSIS — I10 Essential (primary) hypertension: Secondary | ICD-10-CM | POA: Diagnosis not present

## 2023-11-14 DIAGNOSIS — H348322 Tributary (branch) retinal vein occlusion, left eye, stable: Secondary | ICD-10-CM | POA: Insufficient documentation

## 2023-11-14 DIAGNOSIS — R7303 Prediabetes: Secondary | ICD-10-CM | POA: Diagnosis not present

## 2023-11-14 DIAGNOSIS — Z87442 Personal history of urinary calculi: Secondary | ICD-10-CM | POA: Diagnosis not present

## 2023-11-14 DIAGNOSIS — R110 Nausea: Secondary | ICD-10-CM

## 2023-11-14 DIAGNOSIS — E559 Vitamin D deficiency, unspecified: Secondary | ICD-10-CM

## 2023-11-14 DIAGNOSIS — E349 Endocrine disorder, unspecified: Secondary | ICD-10-CM

## 2023-11-14 DIAGNOSIS — E538 Deficiency of other specified B group vitamins: Secondary | ICD-10-CM

## 2023-11-14 DIAGNOSIS — Z6836 Body mass index (BMI) 36.0-36.9, adult: Secondary | ICD-10-CM | POA: Diagnosis not present

## 2023-11-14 DIAGNOSIS — E782 Mixed hyperlipidemia: Secondary | ICD-10-CM | POA: Diagnosis not present

## 2023-11-14 DIAGNOSIS — M542 Cervicalgia: Secondary | ICD-10-CM

## 2023-11-14 DIAGNOSIS — L811 Chloasma: Secondary | ICD-10-CM

## 2023-11-14 LAB — POCT URINALYSIS DIP (CLINITEK)
Bilirubin, UA: NEGATIVE
Glucose, UA: NEGATIVE mg/dL
Ketones, POC UA: NEGATIVE mg/dL
Leukocytes, UA: NEGATIVE
Nitrite, UA: NEGATIVE
POC PROTEIN,UA: NEGATIVE
Spec Grav, UA: 1.02 (ref 1.010–1.025)
Urobilinogen, UA: 0.2 U/dL
pH, UA: 6 (ref 5.0–8.0)

## 2023-11-14 MED ORDER — VITAMIN D (ERGOCALCIFEROL) 1.25 MG (50000 UNIT) PO CAPS
50000.0000 [IU] | ORAL_CAPSULE | ORAL | 3 refills | Status: AC
  Filled 2023-11-14 – 2024-01-22 (×2): qty 12, 84d supply, fill #0
  Filled 2024-04-23: qty 12, 84d supply, fill #1
  Filled 2024-07-17: qty 12, 84d supply, fill #2
  Filled 2024-10-04: qty 12, 84d supply, fill #3

## 2023-11-14 MED ORDER — TRETINOIN 0.05 % EX CREA
TOPICAL_CREAM | Freq: Every day | CUTANEOUS | 6 refills | Status: AC
  Filled 2023-11-14: qty 45, 30d supply, fill #0
  Filled 2024-09-02: qty 45, 30d supply, fill #1

## 2023-11-14 MED ORDER — TAMSULOSIN HCL 0.4 MG PO CAPS
0.4000 mg | ORAL_CAPSULE | Freq: Every day | ORAL | 3 refills | Status: DC
  Filled 2023-11-14: qty 30, 30d supply, fill #0
  Filled 2023-12-10: qty 30, 30d supply, fill #1

## 2023-11-14 MED ORDER — BUPROPION HCL ER (XL) 300 MG PO TB24
300.0000 mg | ORAL_TABLET | Freq: Every day | ORAL | 3 refills | Status: DC
  Filled 2023-11-14: qty 90, 90d supply, fill #0
  Filled 2024-02-26: qty 90, 90d supply, fill #1
  Filled 2024-05-25: qty 90, 90d supply, fill #2
  Filled 2024-08-25: qty 90, 90d supply, fill #3

## 2023-11-14 MED ORDER — VITAMIN B-12 100 MCG PO TABS
100.0000 ug | ORAL_TABLET | Freq: Every day | ORAL | 3 refills | Status: AC
  Filled 2023-11-14: qty 90, 90d supply, fill #0
  Filled 2024-01-22: qty 100, 100d supply, fill #0
  Filled 2024-06-29: qty 100, 100d supply, fill #1

## 2023-11-14 MED ORDER — METOPROLOL TARTRATE 25 MG PO TABS
25.0000 mg | ORAL_TABLET | Freq: Two times a day (BID) | ORAL | 3 refills | Status: DC
  Filled 2023-11-14 – 2024-01-24 (×2): qty 180, 90d supply, fill #0
  Filled 2024-04-23: qty 180, 90d supply, fill #1
  Filled 2024-05-25 – 2024-07-17 (×2): qty 180, 90d supply, fill #2
  Filled 2024-10-21: qty 180, 90d supply, fill #3

## 2023-11-14 MED ORDER — CYCLOBENZAPRINE HCL 10 MG PO TABS
10.0000 mg | ORAL_TABLET | Freq: Three times a day (TID) | ORAL | 0 refills | Status: DC | PRN
  Filled 2023-11-14: qty 30, 10d supply, fill #0

## 2023-11-14 MED ORDER — PHENTERMINE HCL 37.5 MG PO TABS
37.5000 mg | ORAL_TABLET | Freq: Every day | ORAL | 0 refills | Status: DC
  Filled 2023-11-14: qty 90, 90d supply, fill #0

## 2023-11-14 NOTE — Patient Instructions (Signed)
WEIGHT LOSS PLANNING Your progress today shows:     11/14/2023    9:29 AM 10/02/2023    2:06 PM 08/15/2023    1:25 PM  Vitals with BMI  Height 5\' 5"     Weight 167 lbs 172 lbs 178 lbs  BMI 27.79    Systolic 126    Diastolic 82    Pulse 64      For best management of weight, it is vital to balance intake versus output. This means the number of calories burned per day must be less than the calories you take in with food and drink.   I recommend trying to follow a diet with the following: Calories: 1200-1500 calories per day Carbohydrates: 150-180 grams of carbohydrates per day  Why: Gives your body enough "quick fuel" for cells to maintain normal function without sending them into starvation mode.  Protein: At least 90 grams of protein per day- 30 grams with each meal Why: Protein takes longer and uses more energy than carbohydrates to break down for fuel. The carbohydrates in your meals serves as quick energy sources and proteins help use some of that extra quick energy to break down to produce long term energy. This helps you not feel hungry as quickly and protein breakdown burns calories.  Water: Drink AT LEAST 64 ounces of water per day  Why: Water is essential to healthy metabolism. Water helps to fill the stomach and keep you fuller longer. Water is required for healthy digestion and filtering of waste in the body.  Fat: Limit fats in your diet- when choosing fats, choose foods with lower fats content such as lean meats (chicken, fish, [place]).  Why: Increased fat intake leads to storage "for later". Once you burn your carbohydrate energy, your body goes into fat and protein breakdown mode to help you loose weight.  Cholesterol: Fats and oils that are LIQUID at room temperature are best. Choose vegetable oils (olive oil, avocado oil, nuts). Avoid fats that are SOLID at room temperature (animal fats, processed meats). Healthy fats are often found in whole grains, beans, nuts, seeds, and  berries.  Why: Elevated cholesterol levels lead to build up of cholesterol on the inside of your blood vessels. This will eventually cause the blood vessels to become hard and can lead to high blood pressure and damage to your organs. When the blood flow is reduced, but the pressure is high from cholesterol buildup, parts of the cholesterol can break off and form clots that can go to the brain or heart leading to a stroke or heart attack.  Fiber: Increase amount of SOLUBLE the fiber in your diet. This helps to fill you up, lowers cholesterol, and helps with digestion. Some foods high in soluble fiber are oats, peas, beans, apples, carrots, barley, and citrus fruits.   Why: Fiber fills you up, helps remove excess cholesterol, and aids in healthy digestion which are all very important in weight management.   I recommend the following as a minimum activity routine: Purposeful walk or other physical activity at least 20 minutes every single day. This means purposefully taking a walk, jog, bike, swim, treadmill, elliptical, dance, etc.  This activity should be ABOVE your normal daily activities, such as walking at work. Goal exercise should be at least 150 minutes a week- work your way up to this.   Heart Rate: Your maximum exercise heart rate should be 220 - Your Age in Years. When exercising, get your heart rate up, but avoid going  over the maximum targeted heart rate.  60-70% of your maximum heart rate is where you tend to burn the most fat. To find this number:  220 - Age In Years= Max HR  Max HR x 0.6 (or 0.7) = Fat Burning HR The Fat Burning HR is your goal heart rate while working out to burn the most fat.  NEVER exercise to the point your feel lightheaded, weak, nauseated, dizzy. If you experience ANY of these symptoms- STOP exercise! Allow yourself to cool down and your heart rate to come down. Then restart slower next time.  If at ANY TIME you feel chest pain or chest pressure during  exercise, STOP IMMEDIATELY and seek medical attention.

## 2023-11-14 NOTE — Progress Notes (Signed)
Tara Clamp, DNP, AGNP-c [facility]  [address] [place], [place] [address] [phone]  ESTABLISHED PATIENT- Chronic Health and/or Follow-Up Visit  Blood pressure 126/82, pulse 64, height 5\' 5"  (1.651 m), weight 167 lb (75.8 kg).    [name] is a 57 y.o. year old female presenting today for evaluation and management of chronic conditions.   September 2023 BMI 36.99 weight 222lb Today BMI 27.7 weight 167lb Goal BMI less than 26  History of Present Illness [name], who has been on a weight loss journey, reports a significant decrease in weight from a BMI of 36.9 to 27 over the past year. She attributes this to a combination of dietary changes, increased physical activity, and the use of weight loss medication, Zepbound. The patient has been taking Zepbound since July and has been stretching out the dosage to maintain the effects. She reports no significant side effects from the medication and feels that her eating habits have improved.  The patient also reports occasional discomfort in the kidney area, which she associates with a past history of kidney stones. She describes the pain as intermittent and not severe, and she manages it by increasing water intake. She has not noticed any blood in her urine.  Additionally, the patient mentions experiencing muscle tension and pain in the trapezius and levator scapulae muscles. She does work prolonged hours on a computer, which is likely a contributor.  She has been trying various methods to alleviate this discomfort, including the use of a cervical traction device.  The patient also notes some changes in her skin due to weight loss and aging. She reports feeling her tailbone more prominently due to the loss of fat padding and has noticed some loose skin, particularly in the breast and buttock areas. She has been using various lotions and creams to manage these changes.  Lastly, the patient mentions a  history of COVID-19 infection and plans to receive the vaccine in January. She has not noticed any lingering effects from the infection. She also reports good control of her mood and sleep.  All ROS negative with exception of what is listed above.   PHYSICAL EXAM Physical Exam Vitals and nursing note reviewed.  Constitutional:      Appearance: Normal appearance.  HENT:     Head: Normocephalic.  Eyes:     Pupils: Pupils are equal, round, and reactive to light.  Cardiovascular:     Rate and Rhythm: Normal rate and regular rhythm.     Pulses: Normal pulses.     Heart sounds: Normal heart sounds.  Pulmonary:     Effort: Pulmonary effort is normal.     Breath sounds: Normal breath sounds.  Abdominal:     General: Bowel sounds are normal.     Palpations: Abdomen is soft.  Musculoskeletal:        General: Normal range of motion.     Cervical back: Normal range of motion.  Skin:    General: Skin is warm.  Neurological:     General: No focal deficit present.     Mental Status: She is alert and oriented to person, place, and time.  Psychiatric:        Mood and Affect: Mood normal.      PLAN Problem List Items Addressed This Visit     Essential hypertension    BP excellent with no concerns present. Continue metoprolol.       Relevant Medications   buPROPion (WELLBUTRIN XL) 300 MG 24 hr  tablet   metoprolol tartrate (LOPRESSOR) 25 MG tablet   Other Relevant Orders   CBC with Differential/Platelet (Completed)   CMP14+EGFR (Completed)   Hemoglobin A1c (Completed)   Lipid panel (Completed)   Hyperlipidemia    Managed with diet and omega 3 FA. Labs pending. Continue with diet and exercise.       Relevant Medications   buPROPion (WELLBUTRIN XL) 300 MG 24 hr tablet   metoprolol tartrate (LOPRESSOR) 25 MG tablet   Other Relevant Orders   CBC with Differential/Platelet (Completed)   CMP14+EGFR (Completed)   Hemoglobin A1c (Completed)   Lipid panel (Completed)   CBC with  Differential/Platelet (Completed)   CMP14+EGFR (Completed)   Hemoglobin A1c (Completed)   Lipid panel (Completed)   BMI 36.0-36.9,adult - Primary    BMI decreased from 36.99 to 27 over the past year. Current weight is 167 lbs; goal weight is 150-155 lbs. Patient has been using phentermine intermittently and Zepbound (semaglutide) 15 mg since July 24, 2022. Discussed maintaining dietary changes and exercise. Patient concerned about weight regain if medication is tapered. Discussed tapering Zepbound by extending dosing intervals to 10 days, then 12-14 days to assess tolerance. Explained potential increased hunger during tapering but emphasized continued dietary and exercise regimen. - Continue Zepbound 15 mg weekly - Consider extending dosing interval to 10 days, then 12-14 days to assess tolerance - May continue phentermine as needed while tapering Zepbound - Encourage continued dietary and exercise regimen that she has been maintaining.        Relevant Medications   phentermine (ADIPEX-P) 37.5 MG tablet   buPROPion (WELLBUTRIN XL) 300 MG 24 hr tablet   Other Relevant Orders   CBC with Differential/Platelet (Completed)   CMP14+EGFR (Completed)   Hemoglobin A1c (Completed)   Lipid panel (Completed)   Pre-diabetes    Labs pending. Significant weight loss with diet, exercise, and use of GLP-1. No concerns present. Continue current regimen.       Relevant Medications   buPROPion (WELLBUTRIN XL) 300 MG 24 hr tablet   Other Relevant Orders   CBC with Differential/Platelet (Completed)   CMP14+EGFR (Completed)   Hemoglobin A1c (Completed)   Lipid panel (Completed)   History of kidney stones    Patient reports occasional back pain and concerns about potential kidney stones. No recent episodes. Increased water intake. Discussed Flomax (tamsulosin) for symptom management if needed. Explained that Flomax can help relax the ureters, facilitating stone passage and reducing pain. - Order urinalysis  for microscopic hematuria - Prescribe Flomax (tamsulosin) for symptomatic use - Increase water intake      Relevant Medications   tamsulosin (FLOMAX) 0.4 MG CAPS capsule   Other Relevant Orders   POCT URINALYSIS DIP (CLINITEK) (Completed)   Cervical muscle pain    Patient reports pain in the trapezius and levator scapula muscles, likely due to prolonged computer use. Discussed ergonomic adjustments and potential use of cervical traction devices. Explained that cervical traction can help alleviate pain by stretching the vertebrae and maintaining proper neck posture. - Recommend cervical traction device - Prescribe muscle relaxer for symptomatic relief as needed      Relevant Medications   cyclobenzaprine (FLEXERIL) 10 MG tablet   Melasma   Relevant Medications   tretinoin (RETIN-A) 0.05 % cream   RESOLVED: Nausea   Stable branch retinal vein occlusion of left eye   Relevant Medications   metoprolol tartrate (LOPRESSOR) 25 MG tablet   Testosterone deficiency   Vitamin D deficiency   Relevant Medications  Vitamin D, Ergocalciferol, (DRISDOL) 1.25 MG (50000 UNIT) CAPS capsule   Other Relevant Orders   Vitamin D, 25-hydroxy (Completed)   Other Visit Diagnoses     B12 deficiency       Relevant Medications   vitamin B-12 (CYANOCOBALAMIN) 100 MCG tablet       Return in about 6 months (around 05/13/2024) for CPE.  Tara Clamp, DNP, AGNP-c

## 2023-11-15 LAB — CMP14+EGFR
ALT: 16 [IU]/L (ref 0–32)
AST: 17 [IU]/L (ref 0–40)
Albumin: 4.1 g/dL (ref 3.8–4.9)
Alkaline Phosphatase: 52 [IU]/L (ref 44–121)
BUN/Creatinine Ratio: 14 (ref 9–23)
BUN: 13 mg/dL (ref 6–24)
Bilirubin Total: 0.5 mg/dL (ref 0.0–1.2)
CO2: 23 mmol/L (ref 20–29)
Calcium: 9.1 mg/dL (ref 8.7–10.2)
Chloride: 101 mmol/L (ref 96–106)
Creatinine, Ser: 0.92 mg/dL (ref 0.57–1.00)
Globulin, Total: 2.3 g/dL (ref 1.5–4.5)
Glucose: 76 mg/dL (ref 70–99)
Potassium: 4.3 mmol/L (ref 3.5–5.2)
Sodium: 138 mmol/L (ref 134–144)
Total Protein: 6.4 g/dL (ref 6.0–8.5)
eGFR: 72 mL/min/{1.73_m2} (ref >59)

## 2023-11-15 LAB — LIPID PANEL
Chol/HDL Ratio: 4.1 ratio (ref 0.0–4.4)
Cholesterol, Total: 215 mg/dL — ABNORMAL HIGH (ref 100–199)
HDL: 53 mg/dL (ref >39)
LDL Chol Calc (NIH): 149 mg/dL — ABNORMAL HIGH (ref 0–99)
Triglycerides: 73 mg/dL (ref 0–149)
VLDL Cholesterol Cal: 13 mg/dL (ref 5–40)

## 2023-11-15 LAB — CBC WITH DIFFERENTIAL/PLATELET
Basophils Absolute: 0.1 10*3/uL (ref 0.0–0.2)
Basos: 1 %
EOS (ABSOLUTE): 0.2 10*3/uL (ref 0.0–0.4)
Eos: 3 %
Hematocrit: 45.2 % (ref 34.0–46.6)
Hemoglobin: 14.8 g/dL (ref 11.1–15.9)
Immature Grans (Abs): 0 10*3/uL (ref 0.0–0.1)
Immature Granulocytes: 0 %
Lymphocytes Absolute: 2.1 10*3/uL (ref 0.7–3.1)
Lymphs: 36 %
MCH: 31.5 pg (ref 26.6–33.0)
MCHC: 32.7 g/dL (ref 31.5–35.7)
MCV: 96 fL (ref 79–97)
Monocytes Absolute: 0.5 10*3/uL (ref 0.1–0.9)
Monocytes: 9 %
Neutrophils Absolute: 2.9 10*3/uL (ref 1.4–7.0)
Neutrophils: 51 %
Platelets: 244 10*3/uL (ref 150–450)
RBC: 4.7 x10E6/uL (ref 3.77–5.28)
RDW: 13.4 % (ref 11.7–15.4)
WBC: 5.8 10*3/uL (ref 3.4–10.8)

## 2023-11-15 LAB — HEMOGLOBIN A1C
Est. average glucose Bld gHb Est-mCnc: 111 mg/dL
Hgb A1c MFr Bld: 5.5 % (ref 4.8–5.6)

## 2023-11-15 LAB — VITAMIN D 25 HYDROXY (VIT D DEFICIENCY, FRACTURES): Vit D, 25-Hydroxy: 70.9 ng/mL (ref 30.0–100.0)

## 2023-11-24 ENCOUNTER — Encounter: Payer: Self-pay | Admitting: Nurse Practitioner

## 2023-11-24 DIAGNOSIS — L811 Chloasma: Secondary | ICD-10-CM | POA: Insufficient documentation

## 2023-11-24 DIAGNOSIS — M542 Cervicalgia: Secondary | ICD-10-CM | POA: Insufficient documentation

## 2023-11-24 DIAGNOSIS — Z87442 Personal history of urinary calculi: Secondary | ICD-10-CM | POA: Insufficient documentation

## 2023-11-24 NOTE — Assessment & Plan Note (Signed)
Managed with diet and omega 3 FA. Labs pending. Continue with diet and exercise.

## 2023-11-24 NOTE — Assessment & Plan Note (Signed)
BMI decreased from 36.99 to 27 over the past year. Current weight is 167 lbs; goal weight is 150-155 lbs. Patient has been using phentermine intermittently and Zepbound (semaglutide) 15 mg since July 24, 2022. Discussed maintaining dietary changes and exercise. Patient concerned about weight regain if medication is tapered. Discussed tapering Zepbound by extending dosing intervals to 10 days, then 12-14 days to assess tolerance. Explained potential increased hunger during tapering but emphasized continued dietary and exercise regimen. - Continue Zepbound 15 mg weekly - Consider extending dosing interval to 10 days, then 12-14 days to assess tolerance - May continue phentermine as needed while tapering Zepbound - Encourage continued dietary and exercise regimen that she has been maintaining.

## 2023-11-24 NOTE — Assessment & Plan Note (Signed)
Patient reports pain in the trapezius and levator scapula muscles, likely due to prolonged computer use. Discussed ergonomic adjustments and potential use of cervical traction devices. Explained that cervical traction can help alleviate pain by stretching the vertebrae and maintaining proper neck posture. - Recommend cervical traction device - Prescribe muscle relaxer for symptomatic relief as needed

## 2023-11-24 NOTE — Assessment & Plan Note (Signed)
Patient reports occasional back pain and concerns about potential kidney stones. No recent episodes. Increased water intake. Discussed Flomax (tamsulosin) for symptom management if needed. Explained that Flomax can help relax the ureters, facilitating stone passage and reducing pain. - Order urinalysis for microscopic hematuria - Prescribe Flomax (tamsulosin) for symptomatic use - Increase water intake

## 2023-11-24 NOTE — Assessment & Plan Note (Signed)
Labs pending. Significant weight loss with diet, exercise, and use of GLP-1. No concerns present. Continue current regimen.

## 2023-11-24 NOTE — Assessment & Plan Note (Signed)
BP excellent with no concerns present. Continue metoprolol.

## 2023-11-25 ENCOUNTER — Other Ambulatory Visit: Payer: Self-pay

## 2023-11-26 ENCOUNTER — Other Ambulatory Visit: Payer: Self-pay

## 2023-11-26 ENCOUNTER — Ambulatory Visit (HOSPITAL_COMMUNITY)
Admission: RE | Admit: 2023-11-26 | Discharge: 2023-11-26 | Disposition: A | Discharge status: Home / Self Care | Payer: 59 | Source: Ambulatory Visit | Attending: Nurse Practitioner | Admitting: Nurse Practitioner

## 2023-11-26 DIAGNOSIS — M545 Low back pain, unspecified: Secondary | ICD-10-CM | POA: Diagnosis not present

## 2023-11-26 DIAGNOSIS — M47816 Spondylosis without myelopathy or radiculopathy, lumbar region: Secondary | ICD-10-CM | POA: Diagnosis not present

## 2023-12-10 ENCOUNTER — Other Ambulatory Visit: Payer: Self-pay

## 2023-12-10 ENCOUNTER — Other Ambulatory Visit: Payer: Self-pay | Admitting: Nurse Practitioner

## 2023-12-10 ENCOUNTER — Other Ambulatory Visit (HOSPITAL_COMMUNITY): Payer: Self-pay

## 2023-12-10 DIAGNOSIS — M542 Cervicalgia: Secondary | ICD-10-CM

## 2023-12-10 DIAGNOSIS — B009 Herpesviral infection, unspecified: Secondary | ICD-10-CM

## 2023-12-10 MED ORDER — VALACYCLOVIR HCL 500 MG PO TABS
500.0000 mg | ORAL_TABLET | Freq: Two times a day (BID) | ORAL | 1 refills | Status: DC
  Filled 2023-12-10: qty 90, 45d supply, fill #0
  Filled 2024-01-22: qty 90, 45d supply, fill #1

## 2023-12-10 NOTE — Telephone Encounter (Signed)
 Last apt 11/14/23.

## 2023-12-12 ENCOUNTER — Encounter: Payer: Self-pay | Admitting: Nurse Practitioner

## 2023-12-12 ENCOUNTER — Other Ambulatory Visit (HOSPITAL_COMMUNITY): Payer: Self-pay

## 2023-12-12 MED ORDER — CYCLOBENZAPRINE HCL 10 MG PO TABS
10.0000 mg | ORAL_TABLET | Freq: Three times a day (TID) | ORAL | 0 refills | Status: DC | PRN
  Filled 2023-12-12: qty 30, 10d supply, fill #0

## 2023-12-13 ENCOUNTER — Other Ambulatory Visit (HOSPITAL_COMMUNITY): Payer: Self-pay

## 2024-01-07 ENCOUNTER — Other Ambulatory Visit: Payer: Self-pay | Admitting: Nurse Practitioner

## 2024-01-07 ENCOUNTER — Encounter: Payer: Self-pay | Admitting: Nurse Practitioner

## 2024-01-07 DIAGNOSIS — M542 Cervicalgia: Secondary | ICD-10-CM

## 2024-01-07 NOTE — Telephone Encounter (Signed)
 Last apt 11/14/23.

## 2024-01-09 ENCOUNTER — Other Ambulatory Visit (HOSPITAL_COMMUNITY): Payer: Self-pay

## 2024-01-09 ENCOUNTER — Ambulatory Visit: Payer: Commercial Managed Care - PPO | Admitting: Medical

## 2024-01-09 VITALS — BP 110/72 | HR 64 | Wt 161.8 lb

## 2024-01-09 DIAGNOSIS — L608 Other nail disorders: Secondary | ICD-10-CM

## 2024-01-09 MED ORDER — CYCLOBENZAPRINE HCL 10 MG PO TABS
ORAL_TABLET | ORAL | 1 refills | Status: DC
  Filled 2024-01-09: qty 30, 10d supply, fill #0
  Filled 2024-02-02: qty 30, 10d supply, fill #1

## 2024-01-09 NOTE — Progress Notes (Signed)
 Subjective:  [name] is a 58 y.o. female who presents for Chief Complaint  Patient presents with   toe infections    Toe infections- white spots on several toes on both feet     Here for toenail concern.  She went to a nail salon about a year ago.  Since then she has routinely put nail polish on the she took off the nail polish recently and noticed white coloration on both great toenails.  She noticed this back in December several weeks ago.  Was worried about possible fungus.  No other concern, no other symptoms.  No other aggravating or relieving factors.    No other c/o.  The following portions of the patient's history were reviewed and updated as appropriate: allergies, current medications, past family history, past medical history, past social history, past surgical history and problem list.  ROS Otherwise as in subjective above    Objective: BP 110/72   Pulse 64   Wt 161 lb 12.8 oz (73.4 kg)   BMI 26.92 kg/m        General appearance: alert, no distress, well developed, well nourished Nails: There is some white discoloration of both great toenails from the midportion of the nail outward.  The great toenails appear to be pink and normal character from the base of the nail still about midway out from the nail where the white discoloration is located.  No obvious thickening of any nails.  Rest of toenails look fine. Toes neurovascularly intact   Assessment: Encounter Diagnosis  Name Primary?   Toenail deformity Yes     Plan: I suspect this is a chemical alteration of her toenails from what ever product she was using.  No obvious sign of toenail fungus.  We will give this more time with a watch and wait approach to see if the discoloration continues to grow out since the newer nail appears to be normal.  Discussed general hygiene, disinfecting toenail clippers, not letting other people use her toenail clippers, avoid going barefooted in public spaces  such as the gym.  If not resolving over the next 2 months then call or recheck  [name] was seen today for toe infections.  Diagnoses and all orders for this visit:  Toenail deformity    Follow up: prn

## 2024-01-10 ENCOUNTER — Other Ambulatory Visit (HOSPITAL_COMMUNITY): Payer: Self-pay

## 2024-01-22 ENCOUNTER — Other Ambulatory Visit (HOSPITAL_COMMUNITY): Payer: Self-pay

## 2024-01-22 ENCOUNTER — Other Ambulatory Visit: Payer: Self-pay

## 2024-01-24 ENCOUNTER — Other Ambulatory Visit: Payer: Self-pay

## 2024-01-24 ENCOUNTER — Other Ambulatory Visit (HOSPITAL_COMMUNITY): Payer: Self-pay

## 2024-01-30 ENCOUNTER — Other Ambulatory Visit (HOSPITAL_COMMUNITY): Payer: Self-pay

## 2024-01-31 ENCOUNTER — Other Ambulatory Visit: Payer: Self-pay

## 2024-02-03 ENCOUNTER — Other Ambulatory Visit (HOSPITAL_COMMUNITY): Payer: Self-pay

## 2024-02-04 ENCOUNTER — Other Ambulatory Visit: Payer: Self-pay | Admitting: Nurse Practitioner

## 2024-02-04 DIAGNOSIS — Z1231 Encounter for screening mammogram for malignant neoplasm of breast: Secondary | ICD-10-CM

## 2024-02-18 ENCOUNTER — Ambulatory Visit
Admission: RE | Admit: 2024-02-18 | Discharge: 2024-02-18 | Disposition: A | Discharge status: Home / Self Care | Payer: Commercial Managed Care - PPO | Source: Ambulatory Visit

## 2024-02-18 DIAGNOSIS — Z1231 Encounter for screening mammogram for malignant neoplasm of breast: Secondary | ICD-10-CM | POA: Diagnosis not present

## 2024-02-21 ENCOUNTER — Other Ambulatory Visit (HOSPITAL_COMMUNITY): Payer: Self-pay

## 2024-02-21 ENCOUNTER — Telehealth: Payer: Commercial Managed Care - PPO | Admitting: Nurse Practitioner

## 2024-02-21 VITALS — BP 127/80 | Wt 158.0 lb

## 2024-02-21 DIAGNOSIS — R202 Paresthesia of skin: Secondary | ICD-10-CM | POA: Diagnosis not present

## 2024-02-21 DIAGNOSIS — M545 Low back pain, unspecified: Secondary | ICD-10-CM

## 2024-02-21 DIAGNOSIS — R6889 Other general symptoms and signs: Secondary | ICD-10-CM | POA: Diagnosis not present

## 2024-02-21 DIAGNOSIS — Z6836 Body mass index (BMI) 36.0-36.9, adult: Secondary | ICD-10-CM | POA: Diagnosis not present

## 2024-02-21 MED ORDER — PHENTERMINE HCL 37.5 MG PO TABS
37.5000 mg | ORAL_TABLET | Freq: Every day | ORAL | 0 refills | Status: DC
Start: 2024-02-21 — End: 2024-05-06
  Filled 2024-02-21: qty 90, 90d supply, fill #0

## 2024-02-21 MED ORDER — HYDROCOD POLI-CHLORPHE POLI ER 10-8 MG/5ML PO SUER
5.0000 mL | Freq: Two times a day (BID) | ORAL | 0 refills | Status: DC | PRN
  Filled 2024-02-21: qty 115, 12d supply, fill #0

## 2024-02-21 MED ORDER — METFORMIN HCL ER 500 MG PO TB24
500.0000 mg | ORAL_TABLET | Freq: Every day | ORAL | 1 refills | Status: DC
  Filled 2024-02-21: qty 90, 90d supply, fill #0
  Filled 2024-05-25: qty 90, 90d supply, fill #1

## 2024-02-21 NOTE — Progress Notes (Signed)
 Virtual Visit Encounter mychart visit.   I connected with  [name] on 02/24/24 at 11:00 AM EST by secure video and audio telemedicine application. I verified that I am speaking with the correct person using two identifiers.   I introduced myself as a Publishing rights manager with the practice. The limitations of evaluation and management by telemedicine discussed with the patient and the availability of in person appointments. The patient expressed verbal understanding and consent to proceed.  Participating parties in this visit include: Myself and patient  The patient is: Patient Location: Home I am: Provider Location: Office/Clinic Subjective:    CC and HPI: [name] is a 58 y.o. year old female presenting for follow up of weight management and flu-like symptoms.  History of Present Illness [name] is a 58 year old female who presents with concerns about the effectiveness of her weight management medication and symptoms of a cold.  She has been experiencing symptoms of a cold for the past two to three days, including a sore throat, congestion, headache, and fatigue. She has been taking Mucinex for symptom relief and feels very tired. No body aches are present, but she describes significant throat pain. She attributes her illness to exposure from her husband, who recently attended a concert and subsequently became ill.  She is concerned about the effectiveness of her current weight management medication, Zepbound, which she has been taking at a dose of 15 mg since July 25, 2023. She describes experiencing 'food noise,' indicating an increase in thoughts about food. Her weight has been fluctuating between 158 and 160 pounds for almost a month, suggesting a plateau in her weight loss journey.  She reports a sensation of skin sensitivity at the base of her neck, which she describes as a side effect of her medication. This sensation is not constant but  tends to occur after her medication dose.  Additionally, she mentions experiencing back pain, which she initially thought was kidney-related. She increased her water intake and reports improvement. She also describes muscular pain, which she manages with a device she purchased online.  Past medical history, Surgical history, Family history not pertinant except as noted below, Social history, Allergies, and medications have been entered into the medical record, reviewed, and corrections made.   Review of Systems:  All review of systems negative except what is listed in the HPI  Objective:    Alert and oriented x 4 Audible congestion and cough Speaking in clear sentences with no shortness of breath. No distress.  Impression and Recommendations:    Problem List Items Addressed This Visit     BMI 36.0-36.9,adult   On tirzepatide (Zepbound) 15 mg since July 25, 2023, with good results in reducing 'food noise' and aiding weight loss. Weight plateaued at 158-160 lbs for the past month. Discussed adding metformin to enhance tirzepatide effects. Metformin is inexpensive and may cause looser stools, counteracting some tirzepatide side effects. Discussed using compounded tirzepatide from a reputable pharmacy due to cost concerns. - Prescribe metformin once daily with dinner or breakfast - Send prescription for metformin to [facility] - Discuss potential benefits and side effects of metformin, including looser stools - Advise investigating reputable compounding pharmacies and report back for a prescription if needed - Send prescription for phentermine for use as needed      Relevant Medications   metFORMIN (GLUCOPHAGE-XR) 500 MG 24 hr tablet   phentermine (ADIPEX-P) 37.5 MG tablet   Acute bilateral low back pain without sciatica  Intermittent back pain, possibly related to kidney issues or muscular strain. Improved with increased water intake and use of a neck expansion device. - Continue  increased water intake - Use neck expansion device as needed for muscular relief      Flu-like symptoms - Primary   Acute onset of sore throat, headache, congestion, and fatigue for 2-3 days, consistent with influenza given the current flu season and recent exposure. Received flu shot, possibly contributing to milder symptoms. Discussed Tamiflu to potentially shorten symptom duration. - Recommend symptomatic treatment with Mucinex - Alternate Tylenol and ibuprofen for headache and throat pain - Encourage rest and hydration - Send prescription for cough medicine - Consider Tamiflu if symptoms persist or worsen - Advise follow-up if not improved next week      Relevant Medications   chlorpheniramine-HYDROcodone (TUSSIONEX) 10-8 MG/5ML   Paresthesia   Intermittent skin sensitivity at the base of the neck, possibly a side effect of tirzepatide. Symptoms include discomfort with light touch or pressure. - Monitor symptoms and report any changes or worsening       orders and follow up as documented in EMR I discussed the assessment and treatment plan with the patient. The patient was provided an opportunity to ask questions and all were answered. The patient agreed with the plan and demonstrated an understanding of the instructions.   The patient was advised to call back or seek an in-person evaluation if the symptoms worsen or if the condition fails to improve as anticipated.  Follow-Up: in 3 months  I provided 28 minutes of non-face-to-face interaction with this non face-to-face encounter including intake, same-day documentation, and chart review.   Tollie Eth, NP , DNP, AGNP-c [facility]

## 2024-02-24 ENCOUNTER — Encounter: Payer: Self-pay | Admitting: Nurse Practitioner

## 2024-02-24 DIAGNOSIS — R6889 Other general symptoms and signs: Secondary | ICD-10-CM | POA: Insufficient documentation

## 2024-02-24 DIAGNOSIS — M545 Low back pain, unspecified: Secondary | ICD-10-CM | POA: Insufficient documentation

## 2024-02-24 DIAGNOSIS — R202 Paresthesia of skin: Secondary | ICD-10-CM | POA: Insufficient documentation

## 2024-02-24 NOTE — Assessment & Plan Note (Signed)
 Intermittent back pain, possibly related to kidney issues or muscular strain. Improved with increased water intake and use of a neck expansion device. - Continue increased water intake - Use neck expansion device as needed for muscular relief

## 2024-02-24 NOTE — Assessment & Plan Note (Signed)
 Acute onset of sore throat, headache, congestion, and fatigue for 2-3 days, consistent with influenza given the current flu season and recent exposure. Received flu shot, possibly contributing to milder symptoms. Discussed Tamiflu to potentially shorten symptom duration. - Recommend symptomatic treatment with Mucinex - Alternate Tylenol and ibuprofen for headache and throat pain - Encourage rest and hydration - Send prescription for cough medicine - Consider Tamiflu if symptoms persist or worsen - Advise follow-up if not improved next week

## 2024-02-24 NOTE — Assessment & Plan Note (Signed)
 On tirzepatide (Zepbound) 15 mg since July 25, 2023, with good results in reducing 'food noise' and aiding weight loss. Weight plateaued at 158-160 lbs for the past month. Discussed adding metformin to enhance tirzepatide effects. Metformin is inexpensive and may cause looser stools, counteracting some tirzepatide side effects. Discussed using compounded tirzepatide from a reputable pharmacy due to cost concerns. - Prescribe metformin once daily with dinner or breakfast - Send prescription for metformin to [facility] - Discuss potential benefits and side effects of metformin, including looser stools - Advise investigating reputable compounding pharmacies and report back for a prescription if needed - Send prescription for phentermine for use as needed

## 2024-02-24 NOTE — Assessment & Plan Note (Signed)
 Intermittent skin sensitivity at the base of the neck, possibly a side effect of tirzepatide. Symptoms include discomfort with light touch or pressure. - Monitor symptoms and report any changes or worsening

## 2024-02-26 ENCOUNTER — Other Ambulatory Visit: Payer: Self-pay | Admitting: Nurse Practitioner

## 2024-02-26 ENCOUNTER — Encounter: Payer: Self-pay | Admitting: Nurse Practitioner

## 2024-02-26 ENCOUNTER — Other Ambulatory Visit (HOSPITAL_COMMUNITY): Payer: Self-pay

## 2024-02-26 ENCOUNTER — Other Ambulatory Visit: Payer: Self-pay

## 2024-02-26 DIAGNOSIS — R6889 Other general symptoms and signs: Secondary | ICD-10-CM

## 2024-02-26 MED ORDER — HYDROCOD POLI-CHLORPHE POLI ER 10-8 MG/5ML PO SUER
5.0000 mL | Freq: Every evening | ORAL | 0 refills | Status: DC | PRN
  Filled 2024-02-26 – 2024-03-03 (×3): qty 115, 23d supply, fill #0

## 2024-03-02 ENCOUNTER — Other Ambulatory Visit (HOSPITAL_COMMUNITY): Payer: Self-pay

## 2024-03-02 ENCOUNTER — Other Ambulatory Visit: Payer: Self-pay | Admitting: Nurse Practitioner

## 2024-03-02 DIAGNOSIS — Z6836 Body mass index (BMI) 36.0-36.9, adult: Secondary | ICD-10-CM

## 2024-03-02 MED ORDER — ZEPBOUND 15 MG/0.5ML ~~LOC~~ SOAJ
15.0000 mg | SUBCUTANEOUS | 5 refills | Status: DC
Start: 2024-03-02 — End: 2024-08-24
  Filled 2024-03-02: qty 2, 28d supply, fill #0
  Filled 2024-03-29: qty 2, 28d supply, fill #1
  Filled 2024-04-29: qty 2, 28d supply, fill #2
  Filled 2024-05-25: qty 2, 28d supply, fill #3
  Filled 2024-06-29: qty 2, 28d supply, fill #4
  Filled 2024-07-25: qty 2, 28d supply, fill #5

## 2024-03-03 ENCOUNTER — Other Ambulatory Visit (HOSPITAL_COMMUNITY): Payer: Self-pay

## 2024-03-06 ENCOUNTER — Other Ambulatory Visit: Payer: Self-pay | Admitting: Nurse Practitioner

## 2024-03-06 DIAGNOSIS — M542 Cervicalgia: Secondary | ICD-10-CM

## 2024-03-09 NOTE — Telephone Encounter (Signed)
Forwarding refill request

## 2024-03-10 ENCOUNTER — Other Ambulatory Visit (HOSPITAL_COMMUNITY): Payer: Self-pay

## 2024-03-10 MED ORDER — CYCLOBENZAPRINE HCL 10 MG PO TABS
ORAL_TABLET | ORAL | 1 refills | Status: DC
  Filled 2024-03-10: qty 30, 10d supply, fill #0
  Filled 2024-03-29: qty 30, 10d supply, fill #1

## 2024-03-11 ENCOUNTER — Other Ambulatory Visit (HOSPITAL_COMMUNITY): Payer: Self-pay

## 2024-03-25 ENCOUNTER — Other Ambulatory Visit: Payer: Self-pay

## 2024-03-30 ENCOUNTER — Other Ambulatory Visit: Payer: Self-pay

## 2024-04-02 ENCOUNTER — Other Ambulatory Visit (HOSPITAL_COMMUNITY): Payer: Self-pay

## 2024-04-29 ENCOUNTER — Other Ambulatory Visit: Payer: Self-pay | Admitting: Nurse Practitioner

## 2024-04-29 DIAGNOSIS — M542 Cervicalgia: Secondary | ICD-10-CM

## 2024-04-30 ENCOUNTER — Other Ambulatory Visit (HOSPITAL_COMMUNITY): Payer: Self-pay

## 2024-04-30 ENCOUNTER — Other Ambulatory Visit: Payer: Self-pay

## 2024-04-30 MED ORDER — CYCLOBENZAPRINE HCL 10 MG PO TABS
ORAL_TABLET | ORAL | 1 refills | Status: DC
  Filled 2024-04-30: qty 30, 10d supply, fill #0

## 2024-05-01 ENCOUNTER — Other Ambulatory Visit (HOSPITAL_COMMUNITY): Payer: Self-pay

## 2024-05-02 ENCOUNTER — Other Ambulatory Visit (HOSPITAL_COMMUNITY): Payer: Self-pay

## 2024-05-06 ENCOUNTER — Other Ambulatory Visit (HOSPITAL_COMMUNITY): Payer: Self-pay

## 2024-05-06 ENCOUNTER — Other Ambulatory Visit: Payer: Self-pay | Admitting: Nurse Practitioner

## 2024-05-06 DIAGNOSIS — Z6836 Body mass index (BMI) 36.0-36.9, adult: Secondary | ICD-10-CM

## 2024-05-06 MED ORDER — PHENTERMINE HCL 37.5 MG PO TABS
37.5000 mg | ORAL_TABLET | Freq: Every day | ORAL | 0 refills | Status: DC
  Filled 2024-05-06: qty 90, 90d supply, fill #0

## 2024-05-06 NOTE — Telephone Encounter (Signed)
 Last apt 02/21/24.

## 2024-05-21 ENCOUNTER — Encounter: Payer: 59 | Admitting: Nurse Practitioner

## 2024-05-25 ENCOUNTER — Encounter (HOSPITAL_COMMUNITY): Payer: Self-pay

## 2024-05-25 ENCOUNTER — Other Ambulatory Visit (HOSPITAL_COMMUNITY): Payer: Self-pay

## 2024-05-25 ENCOUNTER — Other Ambulatory Visit: Payer: Self-pay | Admitting: Nurse Practitioner

## 2024-05-25 DIAGNOSIS — B009 Herpesviral infection, unspecified: Secondary | ICD-10-CM

## 2024-05-25 DIAGNOSIS — R110 Nausea: Secondary | ICD-10-CM

## 2024-05-26 ENCOUNTER — Other Ambulatory Visit (HOSPITAL_COMMUNITY): Payer: Self-pay

## 2024-05-26 ENCOUNTER — Other Ambulatory Visit: Payer: Self-pay

## 2024-05-26 MED ORDER — ONDANSETRON 4 MG PO TBDP
4.0000 mg | ORAL_TABLET | Freq: Three times a day (TID) | ORAL | 0 refills | Status: DC | PRN
  Filled 2024-05-26: qty 20, 7d supply, fill #0

## 2024-06-09 ENCOUNTER — Ambulatory Visit: Admitting: Nurse Practitioner

## 2024-06-09 ENCOUNTER — Other Ambulatory Visit: Payer: Self-pay

## 2024-06-09 ENCOUNTER — Other Ambulatory Visit (HOSPITAL_COMMUNITY): Payer: Self-pay

## 2024-06-09 ENCOUNTER — Encounter: Payer: Self-pay | Admitting: Nurse Practitioner

## 2024-06-09 VITALS — BP 128/74 | HR 60 | Ht 65.0 in | Wt 148.1 lb

## 2024-06-09 DIAGNOSIS — R7303 Prediabetes: Secondary | ICD-10-CM | POA: Diagnosis not present

## 2024-06-09 DIAGNOSIS — R238 Other skin changes: Secondary | ICD-10-CM | POA: Diagnosis not present

## 2024-06-09 DIAGNOSIS — Z6836 Body mass index (BMI) 36.0-36.9, adult: Secondary | ICD-10-CM

## 2024-06-09 DIAGNOSIS — F902 Attention-deficit hyperactivity disorder, combined type: Secondary | ICD-10-CM

## 2024-06-09 DIAGNOSIS — E782 Mixed hyperlipidemia: Secondary | ICD-10-CM

## 2024-06-09 DIAGNOSIS — Z0000 Encounter for general adult medical examination without abnormal findings: Secondary | ICD-10-CM | POA: Diagnosis not present

## 2024-06-09 DIAGNOSIS — I10 Essential (primary) hypertension: Secondary | ICD-10-CM | POA: Diagnosis not present

## 2024-06-09 DIAGNOSIS — E559 Vitamin D deficiency, unspecified: Secondary | ICD-10-CM

## 2024-06-09 DIAGNOSIS — F3341 Major depressive disorder, recurrent, in partial remission: Secondary | ICD-10-CM | POA: Insufficient documentation

## 2024-06-09 DIAGNOSIS — L659 Nonscarring hair loss, unspecified: Secondary | ICD-10-CM

## 2024-06-09 DIAGNOSIS — H35033 Hypertensive retinopathy, bilateral: Secondary | ICD-10-CM | POA: Insufficient documentation

## 2024-06-09 DIAGNOSIS — M542 Cervicalgia: Secondary | ICD-10-CM | POA: Diagnosis not present

## 2024-06-09 LAB — LIPID PANEL

## 2024-06-09 MED ORDER — AMPHETAMINE-DEXTROAMPHETAMINE 10 MG PO TABS
10.0000 mg | ORAL_TABLET | Freq: Two times a day (BID) | ORAL | 0 refills | Status: DC
Start: 2024-06-09 — End: 2024-07-08
  Filled 2024-06-09: qty 60, 30d supply, fill #0

## 2024-06-09 MED ORDER — CYCLOBENZAPRINE HCL 10 MG PO TABS
10.0000 mg | ORAL_TABLET | Freq: Three times a day (TID) | ORAL | 1 refills | Status: DC | PRN
  Filled 2024-06-09: qty 30, 10d supply, fill #0
  Filled 2024-07-25: qty 30, 10d supply, fill #1

## 2024-06-09 NOTE — Patient Instructions (Addendum)
 When you are ready to start to taper down on the Zepbound  let me know and I will be happy to send in the new doses for you.   Let me know how the Adderall works for you and if this dose is effective I will be happy to send this in for you.

## 2024-06-09 NOTE — Progress Notes (Signed)
 Dell Fennel, DNP, AGNP-c [facility] [address] [place], [place] [address] Main Office [phone]  BP 128/74   Pulse 60   Ht 5' 5 (1.651 m)   Wt 148 lb 1.6 oz (67.2 kg)   LMP  (LMP Unknown)   BMI 24.65 kg/m    Subjective:    Patient ID: [name], female    DOB: [birth year], 58 y.o.   MRN: [id number]  HPI: History of Present Illness [name] is a 58 year old female who presents for annual exam with concerns about hair loss and potential ADHD symptoms.  She has been experiencing hair loss, which she attributes to rapid weight loss and nutritional changes. She has been taking a hair growth supplement called 'super force factor hair growth accelerator' for a couple of months and believes it has improved her condition. The hair loss was not severe but noticeable enough to cause concern.  She is currently on a high dose of Zepbound  for weight management and has been spacing out the doses. Her weight has been stable around 148-150 pounds over the last six months. She also reports a history of cysts that have resolved.   She describes skin sensitivity on her upper body, particularly when tags on her shirts touch her skin. She experiences a sensation in her trapezius muscle that feels like a pull, especially when at work or doing dishes. She uses Flexeril  occasionally to manage this discomfort and improve sleep.  She expresses concerns about potential ADHD symptoms, including difficulty with time management, being easily distracted, and hyperactivity. She recalls similar behaviors in college, such as procrastination and being easily distracted in [employer]. She has been using phentermine  and notes it helps with focus.  Her current medications include metformin , bupropion , metoprolol , and Flexeril . She recently refilled her bupropion  and is considering stopping metformin . She is also using phentermine .    Pertinent items are noted in  HPI.    Most Recent Depression Screen:     06/09/2024   10:57 AM 11/14/2023    9:29 AM 11/13/2022    9:17 AM 09/11/2022    9:00 AM 08/21/2022    9:01 AM  Depression screen PHQ 2/9  Decreased Interest 0 0 0 0 0  Down, Depressed, Hopeless 0 0 0 0 0  PHQ - 2 Score 0 0 0 0 0  Altered sleeping   0 0 0  Tired, decreased energy   0 0 0  Change in appetite   0 0 0  Feeling bad or failure about yourself    0  0  Trouble concentrating   0 1 0  Moving slowly or fidgety/restless   0 0 0  Suicidal thoughts   0 0 0  PHQ-9 Score   0 1 0  Difficult doing work/chores   Not difficult at all  Not difficult at all   Most Recent Anxiety Screen:      No data to display         Most Recent Fall Screen:    06/09/2024   10:57 AM 11/14/2023    9:29 AM 11/13/2022    9:20 AM 09/11/2022    8:59 AM 08/21/2022    9:00 AM  Fall Risk   Falls in the past year? 0 0 0 0 0  Number falls in past yr: 0 0  0 0  Injury with Fall? 0 0  0 0  Risk for fall due to : No Fall Risks No Fall Risks  No Fall Risks No Fall Risks  Follow up Falls evaluation completed Falls evaluation completed  Falls evaluation completed;Education provided  Falls evaluation completed;Education provided      Data saved with a previous flowsheet row definition    Past medical history, surgical history, medications, allergies, family history and social history reviewed with patient today and changes made to appropriate areas of the chart.  Past Medical History:  Past Medical History:  Diagnosis Date   Dizziness 10/21/2015   Essential hypertension 10/21/2015   HSV (herpes simplex virus) infection    Hyperlipidemia 10/21/2015   Kidney calculi    Medication management 08/21/2022   Nausea 03/04/2023   Medications:  Current Outpatient Medications on File Prior to Visit  Medication Sig   buPROPion  (WELLBUTRIN  XL) 300 MG 24 hr tablet Take 1 tablet (300 mg total) by mouth daily.   fish oil-omega-3 fatty acids 1000 MG capsule Take 2 g  by mouth daily.   Melatonin 5 MG CAPS Take 1 capsule by mouth at bedtime.   metoprolol  tartrate (LOPRESSOR ) 25 MG tablet Take 1 tablet (25 mg total) by mouth 2 (two) times daily.   NONFORMULARY OR COMPOUNDED ITEM Topical testosterone  cream 1mg /0.69ml.  Apply topically 0.1ml topically three times weekly to thigh.  Disp: 3 month supply.  #1RF.   NONFORMULARY OR COMPOUNDED ITEM Take 3 capsules by mouth daily.   tirzepatide  (ZEPBOUND ) 15 MG/0.5ML Pen Inject 15 mg into the skin once a week.   vitamin B-12 (CYANOCOBALAMIN ) 100 MCG tablet Take 1 tablet (100 mcg total) by mouth daily.   Vitamin D , Ergocalciferol , (DRISDOL ) 1.25 MG (50000 UNIT) CAPS capsule Take 1 capsule (50,000 Units total) by mouth every 7 (seven) days.   fluticasone  (FLONASE ) 50 MCG/ACT nasal spray Place 2 sprays into both nostrils daily. (Patient not taking: Reported on 06/09/2024)   tretinoin  (RETIN-A ) 0.05 % cream Apply topically at bedtime. (Patient not taking: Reported on 06/09/2024)   valACYclovir  (VALTREX ) 500 MG tablet Take 1 tablet (500 mg) by mouth 2 times daily as needed for 3 days (Patient not taking: Reported on 06/09/2024)   No current facility-administered medications on file prior to visit.   Surgical History:  Past Surgical History:  Procedure Laterality Date   TONSILLECTOMY     Allergies:  No Known Allergies Family History:  Family History  Problem Relation Age of Onset   Atrial fibrillation Sister    CAD Brother    Breast cancer Neg Hx    BRCA 1/2 Neg Hx        Objective:    BP 128/74   Pulse 60   Ht 5' 5 (1.651 m)   Wt 148 lb 1.6 oz (67.2 kg)   LMP  (LMP Unknown)   BMI 24.65 kg/m   Wt Readings from Last 3 Encounters:  06/09/24 148 lb 1.6 oz (67.2 kg)  02/21/24 158 lb (71.7 kg)  01/09/24 161 lb 12.8 oz (73.4 kg)    Physical Exam Vitals and nursing note reviewed.  Constitutional:      General: She is not in acute distress.    Appearance: Normal appearance.  HENT:     Head: Normocephalic  and atraumatic.     Right Ear: Hearing, tympanic membrane, ear canal and external ear normal.     Left Ear: Hearing, tympanic membrane, ear canal and external ear normal.     Nose: Nose normal.     Right Sinus: No maxillary sinus tenderness or frontal sinus tenderness.     Left Sinus: No  maxillary sinus tenderness or frontal sinus tenderness.     Mouth/Throat:     Lips: Pink.     Mouth: Mucous membranes are moist.     Pharynx: Oropharynx is clear.   Eyes:     General: Lids are normal. Vision grossly intact.     Extraocular Movements: Extraocular movements intact.     Conjunctiva/sclera: Conjunctivae normal.     Pupils: Pupils are equal, round, and reactive to light.     Funduscopic exam:    Right eye: Red reflex present.        Left eye: Red reflex present.    Visual Fields: Right eye visual fields normal and left eye visual fields normal.   Neck:     Thyroid : No thyromegaly.     Vascular: No carotid bruit.   Cardiovascular:     Rate and Rhythm: Normal rate and regular rhythm.     Chest Wall: PMI is not displaced.     Pulses: Normal pulses.          Dorsalis pedis pulses are 2+ on the right side and 2+ on the left side.       Posterior tibial pulses are 2+ on the right side and 2+ on the left side.     Heart sounds: Normal heart sounds. No murmur heard. Pulmonary:     Effort: Pulmonary effort is normal. No respiratory distress.     Breath sounds: Normal breath sounds.  Abdominal:     General: Abdomen is flat. Bowel sounds are normal. There is no distension.     Palpations: Abdomen is soft. There is no hepatomegaly, splenomegaly or mass.     Tenderness: There is no abdominal tenderness. There is no right CVA tenderness, left CVA tenderness, guarding or rebound.   Musculoskeletal:        General: Normal range of motion.     Cervical back: Full passive range of motion without pain, normal range of motion and neck supple. No tenderness.     Right lower leg: No edema.     Left  lower leg: No edema.  Feet:     Left foot:     Toenail Condition: Left toenails are normal.  Lymphadenopathy:     Cervical: No cervical adenopathy.     Upper Body:     Right upper body: No supraclavicular adenopathy.     Left upper body: No supraclavicular adenopathy.   Skin:    General: Skin is warm and dry.     Capillary Refill: Capillary refill takes less than 2 seconds.     Nails: There is no clubbing.   Neurological:     General: No focal deficit present.     Mental Status: She is alert and oriented to person, place, and time.     GCS: GCS eye subscore is 4. GCS verbal subscore is 5. GCS motor subscore is 6.     Sensory: Sensation is intact. No sensory deficit.     Motor: Motor function is intact. No weakness.     Coordination: Coordination is intact. Coordination normal.     Gait: Gait is intact. Gait normal.     Deep Tendon Reflexes: Reflexes are normal and symmetric.   Psychiatric:        Attention and Perception: Attention normal.        Mood and Affect: Mood normal.        Speech: Speech normal.        Behavior: Behavior normal. Behavior is cooperative.  Thought Content: Thought content normal.        Cognition and Memory: Cognition and memory normal.        Judgment: Judgment normal.      Results for orders placed or performed in visit on 06/09/24  Hemoglobin A1c   Collection Time: 06/09/24  2:23 PM  Result Value Ref Range   Hgb A1c MFr Bld 5.2 4.8 - 5.6 %   Est. average glucose Bld gHb Est-mCnc 103 mg/dL  CBC with Differential/Platelet   Collection Time: 06/09/24  2:23 PM  Result Value Ref Range   WBC 5.8 3.4 - 10.8 x10E3/uL   RBC 4.78 3.77 - 5.28 x10E6/uL   Hemoglobin 15.5 11.1 - 15.9 g/dL   Hematocrit 16.1 (H) 09.6 - 46.6 %   MCV 98 (H) 79 - 97 fL   MCH 32.4 26.6 - 33.0 pg   MCHC 33.1 31.5 - 35.7 g/dL   RDW 04.5 40.9 - 81.1 %   Platelets 236 150 - 450 x10E3/uL   Neutrophils 49 Not Estab. %   Lymphs 39 Not Estab. %   Monocytes 9 Not Estab. %    Eos 2 Not Estab. %   Basos 1 Not Estab. %   Neutrophils Absolute 2.9 1.4 - 7.0 x10E3/uL   Lymphocytes Absolute 2.3 0.7 - 3.1 x10E3/uL   Monocytes Absolute 0.5 0.1 - 0.9 x10E3/uL   EOS (ABSOLUTE) 0.1 0.0 - 0.4 x10E3/uL   Basophils Absolute 0.1 0.0 - 0.2 x10E3/uL   Immature Granulocytes 0 Not Estab. %   Immature Grans (Abs) 0.0 0.0 - 0.1 x10E3/uL  Comprehensive metabolic panel with GFR   Collection Time: 06/09/24  2:23 PM  Result Value Ref Range   Glucose 63 (L) 70 - 99 mg/dL   BUN 12 6 - 24 mg/dL   Creatinine, Ser 9.14 0.57 - 1.00 mg/dL   eGFR 73 >78 GN/FAO/1.30   BUN/Creatinine Ratio 13 9 - 23   Sodium 144 134 - 144 mmol/L   Potassium 4.2 3.5 - 5.2 mmol/L   Chloride 107 (H) 96 - 106 mmol/L   CO2 22 20 - 29 mmol/L   Calcium 9.2 8.7 - 10.2 mg/dL   Total Protein 6.4 6.0 - 8.5 g/dL   Albumin 4.2 3.8 - 4.9 g/dL   Globulin, Total 2.2 1.5 - 4.5 g/dL   Bilirubin Total 0.4 0.0 - 1.2 mg/dL   Alkaline Phosphatase 54 44 - 121 IU/L   AST 20 0 - 40 IU/L   ALT 17 0 - 32 IU/L  Lipid panel   Collection Time: 06/09/24  2:23 PM  Result Value Ref Range   Cholesterol, Total 192 100 - 199 mg/dL   Triglycerides 66 0 - 149 mg/dL   HDL 58 >86 mg/dL   VLDL Cholesterol Cal 12 5 - 40 mg/dL   LDL Chol Calc (NIH) 578 (H) 0 - 99 mg/dL   Chol/HDL Ratio 3.3 0.0 - 4.4 ratio  TSH   Collection Time: 06/09/24  2:23 PM  Result Value Ref Range   TSH 1.550 0.450 - 4.500 uIU/mL  VITAMIN D  25 Hydroxy (Vit-D Deficiency, Fractures)   Collection Time: 06/09/24  2:23 PM  Result Value Ref Range   Vit D, 25-Hydroxy 72.6 30.0 - 100.0 ng/mL       Assessment & Plan:   Problem List Items Addressed This Visit     Essential hypertension   Chronic. Well controlled with metoprolol . Recommend continuation of medication       Relevant Orders   Hemoglobin  A1c (Completed)   CBC with Differential/Platelet (Completed)   Comprehensive metabolic panel with GFR (Completed)   Lipid panel (Completed)   TSH (Completed)    VITAMIN D  25 Hydroxy (Vit-D Deficiency, Fractures) (Completed)   Hyperlipidemia   Currently managed with diet and exercise. No alarm symptoms. Will continue to monitor.       Relevant Orders   Hemoglobin A1c (Completed)   CBC with Differential/Platelet (Completed)   Comprehensive metabolic panel with GFR (Completed)   Lipid panel (Completed)   TSH (Completed)   VITAMIN D  25 Hydroxy (Vit-D Deficiency, Fractures) (Completed)   BMI 36.0-36.9,adult   Currently on the highest dose of Zepbound  for weight management. Considering spacing out doses to find a maintenance dose that works, potentially reducing to 2.5 mg. Acknowledged potential for minor weight rebound but expects stabilization with lifestyle changes. Discussed new research indicating some patients maintain weight on lower doses. - Consider spacing out Zepbound  doses to every 10 days - Consider reducing Zepbound  dose to 5 mg or 2.5 mg as maintenance - Monitor weight and adjust plan as needed      Relevant Orders   Hemoglobin A1c (Completed)   CBC with Differential/Platelet (Completed)   Comprehensive metabolic panel with GFR (Completed)   Lipid panel (Completed)   TSH (Completed)   VITAMIN D  25 Hydroxy (Vit-D Deficiency, Fractures) (Completed)   Pre-diabetes   Will monitor labs today. Suspect ongoing improvement in blood sugar control with weight loss. Continue with diet and exercise management.       Relevant Orders   Hemoglobin A1c (Completed)   CBC with Differential/Platelet (Completed)   Comprehensive metabolic panel with GFR (Completed)   Lipid panel (Completed)   TSH (Completed)   VITAMIN D  25 Hydroxy (Vit-D Deficiency, Fractures) (Completed)   Vitamin D  deficiency   Recheck labs for monitoring. Consider a possible cause of hair loss.       Relevant Orders   Hemoglobin A1c (Completed)   CBC with Differential/Platelet (Completed)   Comprehensive metabolic panel with GFR (Completed)   Lipid panel (Completed)    TSH (Completed)   VITAMIN D  25 Hydroxy (Vit-D Deficiency, Fractures) (Completed)   Cervical muscle pain   Experiencing pain in the trapezius muscle, possibly muscular or nerve-related. Pain is exacerbated by certain activities, such as doing dishes. Flexeril  is used as needed for relief. - Use Flexeril  as needed for muscle pain      Relevant Medications   cyclobenzaprine  (FLEXERIL ) 10 MG tablet   Attention deficit hyperactivity disorder (ADHD), combined type   Positive screening for ADHD with symptoms of inattention and hyperactivity. Considering trial of medication to manage symptoms. Discussed potential benefits of stimulant medications like Adderall and Ritalin, noting that Adderall is often started first due to its effectiveness. - Start low-dose Adderall, instant release, 10 mg in the morning - Adjust dose based on response and side effects - Consider behavioral strategies for managing symptoms      Relevant Medications   amphetamine -dextroamphetamine  (ADDERALL) 10 MG tablet   Skin sensitivity   Experiencing skin sensitivity, possibly a side effect of Zepbound . Sensitivity is localized and may improve with dose reduction. - Monitor skin sensitivity - Consider reducing Zepbound  dose if sensitivity persists      Hair loss   Experiencing hair loss, likely related to rapid weight loss and potential nutritional deficiencies. Currently taking a hair growth supplement containing biotin and Lustriva, which seems to be helping. - Continue current hair growth supplement      Encounter for annual  physical exam - Primary      Follow up plan: Return in about 6 months (around 12/09/2024) for virtual ADHD f/u.  NEXT PREVENTATIVE PHYSICAL DUE IN 1 YEAR.  PATIENT COUNSELING PROVIDED FOR ALL ADULT PATIENTS: A well balanced diet low in saturated fats, cholesterol, and moderation in carbohydrates.  This can be as simple as monitoring portion sizes and cutting back on sugary beverages such as  soda and juice to start with.    Daily water consumption of at least 64 ounces.  Physical activity at least 180 minutes per week.  If just starting out, start 10 minutes a day and work your way up.   This can be as simple as taking the stairs instead of the elevator and walking 2-3 laps around the office  purposefully every day.   STD protection, partner selection, and regular testing if high risk.  Limited consumption of alcoholic beverages if alcohol is consumed. For men, I recommend no more than 14 alcoholic beverages per week, spread out throughout the week (max 2 per day). Avoid binge drinking or consuming large quantities of alcohol in one setting.  Please let me know if you feel you may need help with reduction or quitting alcohol consumption.   Avoidance of nicotine, if used. Please let me know if you feel you may need help with reduction or quitting nicotine use.   Daily mental health attention. This can be in the form of 5 minute daily meditation, prayer, journaling, yoga, reflection, etc.  Purposeful attention to your emotions and mental state can significantly improve your overall wellbeing  and  Health.  Please know that I am here to help you with all of your health care goals and am happy to work with you to find a solution that works best for you.  The greatest advice I have received with any changes in life are to take it one step at a time, that even means if all you can focus on is the next 60 seconds, then do that and celebrate your victories.  With any changes in life, you will have set backs, and that is OK. The important thing to remember is, if you have a set back, it is not a failure, it is an opportunity to try again! Screening Testing Mammogram Every 1 -2 years based on history and risk factors Starting at age 24 Pap Smear Ages 21-39 every 3 years Ages 77-65 every 5 years with HPV testing More frequent testing may be required based on results and  history Colon Cancer Screening Every 1-10 years based on test performed, risk factors, and history Starting at age 28 Bone Density Screening Every 2-10 years based on history Starting at age 33 for women Recommendations for men differ based on medication usage, history, and risk factors AAA Screening One time ultrasound Men 69-39 years old who have every smoked Lung Cancer Screening Low Dose Lung CT every 12 months Age 40-80 years with a 30 pack-year smoking history who still smoke or who have quit within the last 15 years   Screening Labs Routine  Labs: Complete Blood Count (CBC), Complete Metabolic Panel (CMP), Cholesterol (Lipid Panel) Every 6-12 months based on history and medications May be recommended more frequently based on current conditions or previous results Hemoglobin A1c Lab Every 3-12 months based on history and previous results Starting at age 23 or earlier with diagnosis of diabetes, high cholesterol, BMI >26, and/or risk factors Frequent monitoring for patients with diabetes to ensure blood sugar  control Thyroid  Panel (TSH) Every 6 months based on history, symptoms, and risk factors May be repeated more often if on medication HIV One time testing for all patients 63 and older May be repeated more frequently for patients with increased risk factors or exposure Hepatitis C One time testing for all patients 3 and older May be repeated more frequently for patients with increased risk factors or exposure Gonorrhea, Chlamydia Every 12 months for all sexually active persons 13-24 years Additional monitoring may be recommended for those who are considered high risk or who have symptoms Every 12 months for any woman on birth control, regardless of sexual activity PSA Men 18-7 years old with risk factors Additional screening may be recommended from age 23-69 based on risk factors, symptoms, and history  Vaccine Recommendations Tetanus Booster All adults every 10  years Flu Vaccine All patients 6 months and older every year COVID Vaccine All patients 12 years and older Initial dosing with booster May recommend additional booster based on age and health history HPV Vaccine 2 doses all patients age 16-26 Dosing may be considered for patients over 26 Shingles Vaccine (Shingrix ) 2 doses all adults 55 years and older Pneumonia (Pneumovax 23) All adults 65 years and older May recommend earlier dosing based on health history One year apart from Prevnar 13 Pneumonia (Prevnar 43) All adults 65 years and older Dosed 1 year after Pneumovax 23 Pneumonia (Prevnar 20) One time alternative to the two dosing of 13 and 23 For all adults with initial dose of 23, 20 is recommended 1 year later For all adults with initial dose of 13, 23 is still recommended as second option 1 year later

## 2024-06-10 ENCOUNTER — Ambulatory Visit: Payer: Self-pay | Admitting: Nurse Practitioner

## 2024-06-10 DIAGNOSIS — F902 Attention-deficit hyperactivity disorder, combined type: Secondary | ICD-10-CM

## 2024-06-10 LAB — COMPREHENSIVE METABOLIC PANEL WITH GFR
ALT: 17 IU/L (ref 0–32)
AST: 20 IU/L (ref 0–40)
Albumin: 4.2 g/dL (ref 3.8–4.9)
Alkaline Phosphatase: 54 IU/L (ref 44–121)
BUN/Creatinine Ratio: 13 (ref 9–23)
BUN: 12 mg/dL (ref 6–24)
Bilirubin Total: 0.4 mg/dL (ref 0.0–1.2)
CO2: 22 mmol/L (ref 20–29)
Calcium: 9.2 mg/dL (ref 8.7–10.2)
Chloride: 107 mmol/L — ABNORMAL HIGH (ref 96–106)
Creatinine, Ser: 0.91 mg/dL (ref 0.57–1.00)
Globulin, Total: 2.2 g/dL (ref 1.5–4.5)
Glucose: 63 mg/dL — ABNORMAL LOW (ref 70–99)
Potassium: 4.2 mmol/L (ref 3.5–5.2)
Sodium: 144 mmol/L (ref 134–144)
Total Protein: 6.4 g/dL (ref 6.0–8.5)
eGFR: 73 mL/min/{1.73_m2} (ref >59)

## 2024-06-10 LAB — CBC WITH DIFFERENTIAL/PLATELET
Basophils Absolute: 0.1 10*3/uL (ref 0.0–0.2)
Basos: 1 %
EOS (ABSOLUTE): 0.1 10*3/uL (ref 0.0–0.4)
Eos: 2 %
Hematocrit: 46.8 % — ABNORMAL HIGH (ref 34.0–46.6)
Hemoglobin: 15.5 g/dL (ref 11.1–15.9)
Immature Grans (Abs): 0 10*3/uL (ref 0.0–0.1)
Immature Granulocytes: 0 %
Lymphocytes Absolute: 2.3 10*3/uL (ref 0.7–3.1)
Lymphs: 39 %
MCH: 32.4 pg (ref 26.6–33.0)
MCHC: 33.1 g/dL (ref 31.5–35.7)
MCV: 98 fL — ABNORMAL HIGH (ref 79–97)
Monocytes Absolute: 0.5 10*3/uL (ref 0.1–0.9)
Monocytes: 9 %
Neutrophils Absolute: 2.9 10*3/uL (ref 1.4–7.0)
Neutrophils: 49 %
Platelets: 236 10*3/uL (ref 150–450)
RBC: 4.78 x10E6/uL (ref 3.77–5.28)
RDW: 13.1 % (ref 11.7–15.4)
WBC: 5.8 10*3/uL (ref 3.4–10.8)

## 2024-06-10 LAB — LIPID PANEL
Cholesterol, Total: 192 mg/dL (ref 100–199)
HDL: 58 mg/dL (ref >39)
LDL CALC COMMENT:: 3.3 ratio (ref 0.0–4.4)
LDL Chol Calc (NIH): 122 mg/dL — ABNORMAL HIGH (ref 0–99)
Triglycerides: 66 mg/dL (ref 0–149)
VLDL Cholesterol Cal: 12 mg/dL (ref 5–40)

## 2024-06-10 LAB — VITAMIN D 25 HYDROXY (VIT D DEFICIENCY, FRACTURES): Vit D, 25-Hydroxy: 72.6 ng/mL (ref 30.0–100.0)

## 2024-06-10 LAB — HEMOGLOBIN A1C
Est. average glucose Bld gHb Est-mCnc: 103 mg/dL
Hgb A1c MFr Bld: 5.2 % (ref 4.8–5.6)

## 2024-06-10 LAB — TSH: TSH: 1.55 u[IU]/mL (ref 0.450–4.500)

## 2024-06-17 DIAGNOSIS — F902 Attention-deficit hyperactivity disorder, combined type: Secondary | ICD-10-CM | POA: Insufficient documentation

## 2024-06-17 DIAGNOSIS — L659 Nonscarring hair loss, unspecified: Secondary | ICD-10-CM | POA: Insufficient documentation

## 2024-06-17 DIAGNOSIS — R238 Other skin changes: Secondary | ICD-10-CM | POA: Insufficient documentation

## 2024-06-17 NOTE — Assessment & Plan Note (Signed)
 Positive screening for ADHD with symptoms of inattention and hyperactivity. Considering trial of medication to manage symptoms. Discussed potential benefits of stimulant medications like Adderall and Ritalin, noting that Adderall is often started first due to its effectiveness. - Start low-dose Adderall, instant release, 10 mg in the morning - Adjust dose based on response and side effects - Consider behavioral strategies for managing symptoms

## 2024-06-17 NOTE — Assessment & Plan Note (Signed)
 Chronic. Well controlled with metoprolol . Recommend continuation of medication

## 2024-06-17 NOTE — Assessment & Plan Note (Signed)
 Recheck labs for monitoring. Consider a possible cause of hair loss.

## 2024-06-17 NOTE — Assessment & Plan Note (Signed)
 Currently on the highest dose of Zepbound  for weight management. Considering spacing out doses to find a maintenance dose that works, potentially reducing to 2.5 mg. Acknowledged potential for minor weight rebound but expects stabilization with lifestyle changes. Discussed new research indicating some patients maintain weight on lower doses. - Consider spacing out Zepbound  doses to every 10 days - Consider reducing Zepbound  dose to 5 mg or 2.5 mg as maintenance - Monitor weight and adjust plan as needed

## 2024-06-17 NOTE — Assessment & Plan Note (Signed)
 Currently managed with diet and exercise. No alarm symptoms. Will continue to monitor.

## 2024-06-17 NOTE — Assessment & Plan Note (Signed)
 Will monitor labs today. Suspect ongoing improvement in blood sugar control with weight loss. Continue with diet and exercise management.

## 2024-06-17 NOTE — Assessment & Plan Note (Signed)
 Experiencing hair loss, likely related to rapid weight loss and potential nutritional deficiencies. Currently taking a hair growth supplement containing biotin and Lustriva, which seems to be helping. - Continue current hair growth supplement

## 2024-06-17 NOTE — Assessment & Plan Note (Signed)
 Experiencing skin sensitivity, possibly a side effect of Zepbound . Sensitivity is localized and may improve with dose reduction. - Monitor skin sensitivity - Consider reducing Zepbound  dose if sensitivity persists

## 2024-06-17 NOTE — Assessment & Plan Note (Addendum)
 Experiencing pain in the trapezius muscle, possibly muscular or nerve-related. Pain is exacerbated by certain activities, such as doing dishes. Flexeril  is used as needed for relief. - Use Flexeril  as needed for muscle pain

## 2024-06-29 ENCOUNTER — Other Ambulatory Visit: Payer: Self-pay

## 2024-06-29 ENCOUNTER — Other Ambulatory Visit (HOSPITAL_COMMUNITY): Payer: Self-pay

## 2024-06-29 ENCOUNTER — Other Ambulatory Visit (HOSPITAL_BASED_OUTPATIENT_CLINIC_OR_DEPARTMENT_OTHER): Payer: Self-pay

## 2024-07-08 ENCOUNTER — Other Ambulatory Visit: Payer: Self-pay | Admitting: Family Medicine

## 2024-07-08 ENCOUNTER — Other Ambulatory Visit (HOSPITAL_COMMUNITY): Payer: Self-pay

## 2024-07-08 MED ORDER — AMPHETAMINE-DEXTROAMPHETAMINE 20 MG PO TABS
20.0000 mg | ORAL_TABLET | Freq: Two times a day (BID) | ORAL | 0 refills | Status: DC
  Filled 2024-07-08: qty 60, 30d supply, fill #0

## 2024-07-08 MED ORDER — AMPHETAMINE-DEXTROAMPHETAMINE 10 MG PO TABS
10.0000 mg | ORAL_TABLET | Freq: Two times a day (BID) | ORAL | 0 refills | Status: DC
  Filled 2024-07-08: qty 60, 30d supply, fill #0

## 2024-07-08 NOTE — Addendum Note (Signed)
 Addended by: JOYCE NORLEEN BROCKS on: 07/08/2024 02:42 PM   Modules accepted: Orders

## 2024-07-15 ENCOUNTER — Encounter: Admitting: Nurse Practitioner

## 2024-07-25 ENCOUNTER — Other Ambulatory Visit: Payer: Self-pay | Admitting: Nurse Practitioner

## 2024-07-25 ENCOUNTER — Other Ambulatory Visit (HOSPITAL_COMMUNITY): Payer: Self-pay

## 2024-07-25 ENCOUNTER — Encounter: Payer: Self-pay | Admitting: Nurse Practitioner

## 2024-07-25 DIAGNOSIS — B009 Herpesviral infection, unspecified: Secondary | ICD-10-CM

## 2024-07-27 ENCOUNTER — Other Ambulatory Visit (HOSPITAL_COMMUNITY): Payer: Self-pay

## 2024-07-27 ENCOUNTER — Other Ambulatory Visit: Payer: Self-pay

## 2024-07-27 DIAGNOSIS — R110 Nausea: Secondary | ICD-10-CM

## 2024-07-27 MED ORDER — VALACYCLOVIR HCL 500 MG PO TABS
500.0000 mg | ORAL_TABLET | Freq: Two times a day (BID) | ORAL | 1 refills | Status: AC
  Filled 2024-07-27: qty 90, 45d supply, fill #0

## 2024-07-27 MED ORDER — ONDANSETRON 4 MG PO TBDP
4.0000 mg | ORAL_TABLET | Freq: Three times a day (TID) | ORAL | 2 refills | Status: AC | PRN
  Filled 2024-07-27: qty 20, 7d supply, fill #0
  Filled 2024-10-26 – 2025-02-03 (×2): qty 20, 7d supply, fill #1

## 2024-08-24 ENCOUNTER — Other Ambulatory Visit: Payer: Self-pay | Admitting: Nurse Practitioner

## 2024-08-24 DIAGNOSIS — Z6836 Body mass index (BMI) 36.0-36.9, adult: Secondary | ICD-10-CM

## 2024-08-25 ENCOUNTER — Other Ambulatory Visit (HOSPITAL_COMMUNITY): Payer: Self-pay

## 2024-08-25 MED ORDER — ZEPBOUND 15 MG/0.5ML ~~LOC~~ SOAJ
15.0000 mg | SUBCUTANEOUS | 3 refills | Status: AC
  Filled 2024-08-25: qty 2, 28d supply, fill #0

## 2024-08-26 ENCOUNTER — Other Ambulatory Visit (HOSPITAL_COMMUNITY): Payer: Self-pay

## 2024-08-26 DIAGNOSIS — Z86018 Personal history of other benign neoplasm: Secondary | ICD-10-CM | POA: Diagnosis not present

## 2024-08-26 DIAGNOSIS — D225 Melanocytic nevi of trunk: Secondary | ICD-10-CM | POA: Diagnosis not present

## 2024-08-26 DIAGNOSIS — L814 Other melanin hyperpigmentation: Secondary | ICD-10-CM | POA: Diagnosis not present

## 2024-08-26 DIAGNOSIS — D2272 Melanocytic nevi of left lower limb, including hip: Secondary | ICD-10-CM | POA: Diagnosis not present

## 2024-08-26 DIAGNOSIS — L409 Psoriasis, unspecified: Secondary | ICD-10-CM | POA: Diagnosis not present

## 2024-08-26 DIAGNOSIS — L821 Other seborrheic keratosis: Secondary | ICD-10-CM | POA: Diagnosis not present

## 2024-08-26 DIAGNOSIS — L650 Telogen effluvium: Secondary | ICD-10-CM | POA: Diagnosis not present

## 2024-08-26 DIAGNOSIS — L578 Other skin changes due to chronic exposure to nonionizing radiation: Secondary | ICD-10-CM | POA: Diagnosis not present

## 2024-08-26 DIAGNOSIS — D485 Neoplasm of uncertain behavior of skin: Secondary | ICD-10-CM | POA: Diagnosis not present

## 2024-08-26 MED ORDER — COPPERTONE COMPLETE SPF30 EX LOTN
TOPICAL_LOTION | CUTANEOUS | 12 refills | Status: AC
  Filled 2024-08-26: qty 207, 30d supply, fill #0

## 2024-08-26 MED ORDER — KETOCONAZOLE 2 % EX SHAM
1.0000 | MEDICATED_SHAMPOO | CUTANEOUS | 2 refills | Status: DC
  Filled 2024-08-26: qty 120, 30d supply, fill #0
  Filled 2024-09-08 – 2024-09-21 (×2): qty 120, 30d supply, fill #1
  Filled 2024-10-26 – 2024-10-27 (×2): qty 120, 30d supply, fill #2

## 2024-08-27 ENCOUNTER — Other Ambulatory Visit (HOSPITAL_COMMUNITY): Payer: Self-pay

## 2024-08-27 DIAGNOSIS — H524 Presbyopia: Secondary | ICD-10-CM | POA: Diagnosis not present

## 2024-08-28 ENCOUNTER — Other Ambulatory Visit (HOSPITAL_COMMUNITY): Payer: Self-pay

## 2024-09-02 ENCOUNTER — Other Ambulatory Visit (HOSPITAL_COMMUNITY): Payer: Self-pay

## 2024-09-02 ENCOUNTER — Other Ambulatory Visit: Payer: Self-pay | Admitting: Family Medicine

## 2024-09-03 ENCOUNTER — Other Ambulatory Visit: Payer: Self-pay

## 2024-09-04 ENCOUNTER — Other Ambulatory Visit (HOSPITAL_COMMUNITY): Payer: Self-pay

## 2024-09-04 MED ORDER — AMPHETAMINE-DEXTROAMPHETAMINE 20 MG PO TABS
20.0000 mg | ORAL_TABLET | Freq: Two times a day (BID) | ORAL | 0 refills | Status: DC
  Filled 2024-09-04: qty 60, 30d supply, fill #0

## 2024-09-06 ENCOUNTER — Other Ambulatory Visit (HOSPITAL_COMMUNITY): Payer: Self-pay

## 2024-09-08 ENCOUNTER — Other Ambulatory Visit (HOSPITAL_COMMUNITY): Payer: Self-pay

## 2024-09-08 ENCOUNTER — Other Ambulatory Visit: Payer: Self-pay | Admitting: Nurse Practitioner

## 2024-09-08 DIAGNOSIS — M542 Cervicalgia: Secondary | ICD-10-CM

## 2024-09-08 NOTE — Telephone Encounter (Signed)
 Last apt 06/09/24

## 2024-09-09 ENCOUNTER — Other Ambulatory Visit: Payer: Self-pay

## 2024-09-09 ENCOUNTER — Other Ambulatory Visit (HOSPITAL_COMMUNITY): Payer: Self-pay

## 2024-09-09 MED ORDER — CYCLOBENZAPRINE HCL 10 MG PO TABS
10.0000 mg | ORAL_TABLET | Freq: Three times a day (TID) | ORAL | 1 refills | Status: AC | PRN
  Filled 2024-09-09: qty 30, 10d supply, fill #0
  Filled 2024-12-19: qty 30, 10d supply, fill #1

## 2024-09-14 ENCOUNTER — Other Ambulatory Visit (HOSPITAL_COMMUNITY): Payer: Self-pay

## 2024-09-21 ENCOUNTER — Other Ambulatory Visit (HOSPITAL_COMMUNITY): Payer: Self-pay

## 2024-10-02 DIAGNOSIS — M79641 Pain in right hand: Secondary | ICD-10-CM | POA: Diagnosis not present

## 2024-10-02 DIAGNOSIS — S66821A Laceration of other specified muscles, fascia and tendons at wrist and hand level, right hand, initial encounter: Secondary | ICD-10-CM | POA: Diagnosis not present

## 2024-10-02 DIAGNOSIS — S61216D Laceration without foreign body of right little finger without damage to nail, subsequent encounter: Secondary | ICD-10-CM | POA: Diagnosis not present

## 2024-10-05 ENCOUNTER — Other Ambulatory Visit: Payer: Self-pay

## 2024-10-05 ENCOUNTER — Encounter (HOSPITAL_BASED_OUTPATIENT_CLINIC_OR_DEPARTMENT_OTHER): Payer: Self-pay | Admitting: Orthopedic Surgery

## 2024-10-05 NOTE — Progress Notes (Signed)
   10/05/24 1242  PAT Phone Screen  Is the patient taking a GLP-1 receptor agonist? Yes (10/5 last dose. Reviewed w/ Dr Tilford- ok to proceed)  Has the patient been informed on holding medication? Yes  Do You Have Diabetes? No  Do You Have Hypertension? Yes  Have You Ever Been to the ER for Asthma? No  Have You Taken Oral Steroids in the Past 3 Months? No  Do you Take Phenteramine or any Other Diet Drugs? No  Recent  Lab Work, EKG, CXR? No  Do you have a history of heart problems? No  Have you ever had tests on your heart? Yes  What cardiac tests were performed? Stress Test  What date/year were cardiac tests completed? 2016  Results viewable: [facility] Tab  Any Recent Hospitalizations? No  Height 5' 5 (1.651 m)  Weight 65.8 kg  Pat Appointment Scheduled Yes (EKG)

## 2024-10-06 ENCOUNTER — Encounter (HOSPITAL_BASED_OUTPATIENT_CLINIC_OR_DEPARTMENT_OTHER)
Admission: RE | Admit: 2024-10-06 | Discharge: 2024-10-06 | Disposition: A | Discharge status: Home / Self Care | Source: Ambulatory Visit | Attending: Orthopedic Surgery | Admitting: Orthopedic Surgery

## 2024-10-06 DIAGNOSIS — I10 Essential (primary) hypertension: Secondary | ICD-10-CM | POA: Insufficient documentation

## 2024-10-06 DIAGNOSIS — R9431 Abnormal electrocardiogram [ECG] [EKG]: Secondary | ICD-10-CM | POA: Insufficient documentation

## 2024-10-06 DIAGNOSIS — Z01810 Encounter for preprocedural cardiovascular examination: Secondary | ICD-10-CM | POA: Insufficient documentation

## 2024-10-06 DIAGNOSIS — Z01818 Encounter for other preprocedural examination: Secondary | ICD-10-CM | POA: Diagnosis present

## 2024-10-07 ENCOUNTER — Other Ambulatory Visit: Payer: Self-pay

## 2024-10-07 ENCOUNTER — Ambulatory Visit (HOSPITAL_BASED_OUTPATIENT_CLINIC_OR_DEPARTMENT_OTHER)
Admission: RE | Admit: 2024-10-07 | Discharge: 2024-10-07 | Disposition: A | Discharge status: Home / Self Care | Attending: Orthopedic Surgery | Admitting: Orthopedic Surgery

## 2024-10-07 ENCOUNTER — Ambulatory Visit (HOSPITAL_BASED_OUTPATIENT_CLINIC_OR_DEPARTMENT_OTHER): Admitting: Anesthesiology

## 2024-10-07 ENCOUNTER — Encounter (HOSPITAL_BASED_OUTPATIENT_CLINIC_OR_DEPARTMENT_OTHER): Payer: Self-pay | Admitting: Orthopedic Surgery

## 2024-10-07 ENCOUNTER — Other Ambulatory Visit (HOSPITAL_COMMUNITY): Payer: Self-pay

## 2024-10-07 ENCOUNTER — Encounter (HOSPITAL_BASED_OUTPATIENT_CLINIC_OR_DEPARTMENT_OTHER)
Admission: RE | Disposition: A | Discharge status: Home / Self Care | Payer: Self-pay | Source: Home / Self Care | Attending: Orthopedic Surgery

## 2024-10-07 DIAGNOSIS — X58XXXA Exposure to other specified factors, initial encounter: Secondary | ICD-10-CM | POA: Diagnosis not present

## 2024-10-07 DIAGNOSIS — S66126A Laceration of flexor muscle, fascia and tendon of right little finger at wrist and hand level, initial encounter: Secondary | ICD-10-CM | POA: Diagnosis not present

## 2024-10-07 DIAGNOSIS — S56127A Laceration of flexor muscle, fascia and tendon of right little finger at forearm level, initial encounter: Secondary | ICD-10-CM | POA: Insufficient documentation

## 2024-10-07 DIAGNOSIS — S61216A Laceration without foreign body of right little finger without damage to nail, initial encounter: Secondary | ICD-10-CM | POA: Diagnosis not present

## 2024-10-07 DIAGNOSIS — Z87891 Personal history of nicotine dependence: Secondary | ICD-10-CM | POA: Diagnosis not present

## 2024-10-07 DIAGNOSIS — S86821A Laceration of other muscle(s) and tendon(s) at lower leg level, right leg, initial encounter: Secondary | ICD-10-CM | POA: Diagnosis present

## 2024-10-07 DIAGNOSIS — I10 Essential (primary) hypertension: Secondary | ICD-10-CM | POA: Insufficient documentation

## 2024-10-07 HISTORY — PX: FLEXOR TENDON REPAIR: SHX6501

## 2024-10-07 HISTORY — PX: WOUND EXPLORATION: SHX6188

## 2024-10-07 SURGERY — WOUND EXPLORATION
Anesthesia: Monitor Anesthesia Care | Site: Hand | Laterality: Right

## 2024-10-07 MED ORDER — OXYCODONE HCL 5 MG PO TABS
5.0000 mg | ORAL_TABLET | Freq: Four times a day (QID) | ORAL | 0 refills | Status: AC | PRN
  Filled 2024-10-07: qty 28, 7d supply, fill #0

## 2024-10-07 MED ORDER — FENTANYL CITRATE (PF) 100 MCG/2ML IJ SOLN
50.0000 ug | Freq: Once | INTRAMUSCULAR | Status: AC
  Administered 2024-10-07: 50 ug via INTRAVENOUS

## 2024-10-07 MED ORDER — PROPOFOL 10 MG/ML IV BOLUS
INTRAVENOUS | Status: DC | PRN
  Administered 2024-10-07 (×3): 20 mg via INTRAVENOUS

## 2024-10-07 MED ORDER — OXYCODONE HCL 5 MG PO TABS: 5.0000 mg | ORAL_TABLET | Freq: Once | ORAL | Status: DC | PRN

## 2024-10-07 MED ORDER — LACTATED RINGERS IV SOLN: INTRAVENOUS | Status: DC

## 2024-10-07 MED ORDER — MIDAZOLAM HCL 2 MG/2ML IJ SOLN
2.0000 mg | Freq: Once | INTRAMUSCULAR | Status: AC
  Administered 2024-10-07: 2 mg via INTRAVENOUS

## 2024-10-07 MED ORDER — FENTANYL CITRATE (PF) 100 MCG/2ML IJ SOLN
INTRAMUSCULAR | Status: AC
  Filled 2024-10-07: qty 2

## 2024-10-07 MED ORDER — OXYCODONE HCL 5 MG/5ML PO SOLN: 5.0000 mg | Freq: Once | ORAL | Status: DC | PRN

## 2024-10-07 MED ORDER — LIDOCAINE HCL (CARDIAC) PF 100 MG/5ML IV SOSY
PREFILLED_SYRINGE | INTRAVENOUS | Status: DC | PRN
  Administered 2024-10-07: 40 mg via INTRAVENOUS

## 2024-10-07 MED ORDER — FENTANYL CITRATE (PF) 100 MCG/2ML IJ SOLN: 25.0000 ug | INTRAMUSCULAR | Status: DC | PRN

## 2024-10-07 MED ORDER — DEXMEDETOMIDINE HCL IN NACL 80 MCG/20ML IV SOLN
INTRAVENOUS | Status: DC | PRN
  Administered 2024-10-07: 4 ug via INTRAVENOUS

## 2024-10-07 MED ORDER — DROPERIDOL 2.5 MG/ML IJ SOLN: 0.6250 mg | Freq: Once | INTRAMUSCULAR | Status: DC | PRN

## 2024-10-07 MED ORDER — CEFAZOLIN SODIUM-DEXTROSE 2-4 GM/100ML-% IV SOLN
2.0000 g | INTRAVENOUS | Status: AC
  Administered 2024-10-07: 2 g via INTRAVENOUS

## 2024-10-07 MED ORDER — ACETAMINOPHEN 500 MG PO TABS
1000.0000 mg | ORAL_TABLET | Freq: Once | ORAL | Status: AC
  Administered 2024-10-07: 1000 mg via ORAL

## 2024-10-07 MED ORDER — EPHEDRINE SULFATE (PRESSORS) 50 MG/ML IJ SOLN
INTRAMUSCULAR | Status: DC | PRN
Start: 2024-10-07 — End: 2024-10-07
  Administered 2024-10-07 (×2): 5 mg via INTRAVENOUS

## 2024-10-07 MED ORDER — CEFAZOLIN SODIUM-DEXTROSE 2-4 GM/100ML-% IV SOLN
INTRAVENOUS | Status: AC
  Filled 2024-10-07: qty 100

## 2024-10-07 MED ORDER — ACETAMINOPHEN 500 MG PO TABS
ORAL_TABLET | ORAL | Status: AC
  Filled 2024-10-07: qty 2

## 2024-10-07 MED ORDER — ONDANSETRON HCL 4 MG/2ML IJ SOLN
INTRAMUSCULAR | Status: DC | PRN
  Administered 2024-10-07: 4 mg via INTRAVENOUS

## 2024-10-07 MED ORDER — BUPIVACAINE-EPINEPHRINE (PF) 0.5% -1:200000 IJ SOLN
INTRAMUSCULAR | Status: DC | PRN
  Administered 2024-10-07: 30 mL via PERINEURAL

## 2024-10-07 MED ORDER — MIDAZOLAM HCL 2 MG/2ML IJ SOLN
INTRAMUSCULAR | Status: AC
  Filled 2024-10-07: qty 2

## 2024-10-07 MED ORDER — BACITRACIN 500 UNIT/GM EX OINT
TOPICAL_OINTMENT | CUTANEOUS | Status: DC | PRN
Start: 2024-10-07 — End: 2024-10-07
  Administered 2024-10-07: 1 via TOPICAL

## 2024-10-07 MED ORDER — PROPOFOL 500 MG/50ML IV EMUL
INTRAVENOUS | Status: DC | PRN
  Administered 2024-10-07: 100 ug/kg/min via INTRAVENOUS

## 2024-10-07 MED ORDER — 0.9 % SODIUM CHLORIDE (POUR BTL) OPTIME
TOPICAL | Status: DC | PRN
  Administered 2024-10-07: 1000 mL

## 2024-10-07 SURGICAL SUPPLY — 59 items
BLADE MINI RND TIP GREEN BEAV (BLADE) IMPLANT
BLADE SURG 15 STRL LF DISP TIS (BLADE) ×2 IMPLANT
BNDG COMPR ESMARK 4X3 LF (GAUZE/BANDAGES/DRESSINGS) ×1 IMPLANT
BNDG ELASTIC 2INX 5YD STR LF (GAUZE/BANDAGES/DRESSINGS) IMPLANT
BNDG ELASTIC 3INX 5YD STR LF (GAUZE/BANDAGES/DRESSINGS) ×1 IMPLANT
BNDG GAUZE DERMACEA FLUFF 4 (GAUZE/BANDAGES/DRESSINGS) ×1 IMPLANT
CATH ROBINSON RED A/P 10FR (CATHETERS) IMPLANT
CHLORAPREP W/TINT 26 (MISCELLANEOUS) ×1 IMPLANT
CORD BIPOLAR FORCEPS 12FT (ELECTRODE) ×1 IMPLANT
COVER BACK TABLE 60X90IN (DRAPES) ×1 IMPLANT
CUFF TOURN SGL QUICK 18X4 (TOURNIQUET CUFF) ×1 IMPLANT
DRAPE EXTREMITY T 121X128X90 (DISPOSABLE) ×1 IMPLANT
DRAPE OEC MINIVIEW 54X84 (DRAPES) IMPLANT
DRAPE SURG 17X23 STRL (DRAPES) ×1 IMPLANT
GAUZE 4X4 16PLY ~~LOC~~+RFID DBL (SPONGE) IMPLANT
GAUZE PAD ABD 8X10 STRL (GAUZE/BANDAGES/DRESSINGS) IMPLANT
GAUZE SPONGE 4X4 12PLY STRL (GAUZE/BANDAGES/DRESSINGS) IMPLANT
GAUZE XEROFORM 1X8 LF (GAUZE/BANDAGES/DRESSINGS) ×1 IMPLANT
GLOVE BIO SURGEON STRL SZ7 (GLOVE) ×1 IMPLANT
GLOVE BIOGEL PI IND STRL 7.0 (GLOVE) ×1 IMPLANT
GOWN STRL REUS W/ TWL LRG LVL3 (GOWN DISPOSABLE) ×2 IMPLANT
LOOP VASCLR MAXI BLUE 18IN ST (MISCELLANEOUS) IMPLANT
NDL HYPO 25X1 1.5 SAFETY (NEEDLE) IMPLANT
NDL KEITH (NEEDLE) IMPLANT
NEEDLE HYPO 25X1 1.5 SAFETY (NEEDLE) IMPLANT
NEEDLE KEITH (NEEDLE) IMPLANT
NS IRRIG 1000ML POUR BTL (IV SOLUTION) ×1 IMPLANT
PACK BASIN DAY SURGERY FS (CUSTOM PROCEDURE TRAY) ×1 IMPLANT
PAD CAST 3X4 CTTN HI CHSV (CAST SUPPLIES) ×1 IMPLANT
PAD CAST 4YDX4 CTTN HI CHSV (CAST SUPPLIES) IMPLANT
PADDING CAST ABS COTTON 3X4 (CAST SUPPLIES) IMPLANT
PADDING CAST ABS COTTON 4X4 ST (CAST SUPPLIES) ×1 IMPLANT
PADDING CAST SYNTHETIC 4X4 STR (CAST SUPPLIES) IMPLANT
SHEET MEDIUM DRAPE 40X70 STRL (DRAPES) ×1 IMPLANT
SLEEVE SCD COMPRESS KNEE MED (STOCKING) IMPLANT
SLING ARM FOAM STRAP LRG (SOFTGOODS) IMPLANT
SPIKE FLUID TRANSFER (MISCELLANEOUS) IMPLANT
SPLINT FIBERGLASS 4X30 (CAST SUPPLIES) ×1 IMPLANT
SPLINT PLASTER CAST XFAST 3X15 (CAST SUPPLIES) IMPLANT
SUT ETHIBOND 3-0 V-5 (SUTURE) IMPLANT
SUT ETHILON 3 0 PS 1 (SUTURE) IMPLANT
SUT ETHILON 4 0 PS 2 18 (SUTURE) ×1 IMPLANT
SUT ETHILON 8 0 BV130 4 (SUTURE) IMPLANT
SUT MNCRL AB 4-0 PS2 18 (SUTURE) IMPLANT
SUT MON AB 5-0 PS2 18 (SUTURE) IMPLANT
SUT NYLON 9 0 VRM6 (SUTURE) IMPLANT
SUT PROLENE 6 0 P 1 18 (SUTURE) IMPLANT
SUT SILK 2 0 PERMA HAND 18 BK (SUTURE) IMPLANT
SUT SILK 4 0 PS 2 (SUTURE) IMPLANT
SUT SUPRAMID 4-0 (SUTURE) IMPLANT
SUT VIC AB 4-0 PS2 18 (SUTURE) IMPLANT
SUT VICRYL RAPIDE 4-0 (SUTURE) IMPLANT
SUTURE FIBERWR 3-0 18 TAPR NDL (SUTURE) IMPLANT
SUTURE FIBERWR 4-0 18 DIA BLUE (SUTURE) IMPLANT
SUTURE FIBERWR 4-0 18 TAPR NDL (SUTURE) IMPLANT
SYR BULB EAR ULCER 3OZ GRN STR (SYRINGE) ×1 IMPLANT
SYR CONTROL 10ML LL (SYRINGE) IMPLANT
TOWEL GREEN STERILE FF (TOWEL DISPOSABLE) ×2 IMPLANT
UNDERPAD 30X36 HEAVY ABSORB (UNDERPADS AND DIAPERS) ×1 IMPLANT

## 2024-10-07 NOTE — Transfer of Care (Signed)
 Immediate Anesthesia Transfer of Care Note  Patient: [name]  Procedure(s) Performed: WOUND EXPLORATION (Right: Hand) REPAIR, TENDON, FLEXOR (Right: Hand)  Patient Location: PACU  Anesthesia Type:MAC combined with regional for post-op pain  Level of Consciousness: awake, alert , and oriented  Airway & Oxygen Therapy: Patient Spontanous Breathing  Post-op Assessment: Report given to RN and Post -op Vital signs reviewed and stable  Post vital signs: Reviewed and stable  Last Vitals:  Vitals Value Taken Time  BP 151/77 10/07/24 14:45  Temp    Pulse 69 10/07/24 14:45  Resp 20 10/07/24 14:45  SpO2 93 % 10/07/24 14:45  Vitals shown include unfiled device data.  Last Pain:  Vitals:   10/07/24 1135  TempSrc: Temporal  PainSc: 0-No pain      Patients Stated Pain Goal: 3 (10/07/24 1135)  Complications: No notable events documented.

## 2024-10-07 NOTE — Discharge Instructions (Addendum)
 Carlin Galla, M.D. Hand Surgery  POST-OPERATIVE DISCHARGE INSTRUCTIONS   PRESCRIPTIONS: You may have been given a prescription to be taken as directed for post-operative pain control.  You may also take over the counter ibuprofen /aleve and tylenol  for pain. Take this as directed on the packaging. Do not exceed 3000 mg tylenol /acetaminophen  in 24 hours.  Ibuprofen  600-800 mg (3-4) tablets by mouth every 6 hours as needed for pain.  OR Aleve 2 tablets by mouth every 12 hours (twice daily) as needed for pain.  AND/OR Tylenol  1000 mg (2 tablets) every 8 hours as needed for pain.  Please use your pain medication carefully, as refills are limited and you may not be provided with one.  As stated above, please use over the counter pain medicine - it will also be helpful with decreasing your swelling.    ANESTHESIA: After your surgery, post-surgical discomfort or pain is likely. This discomfort can last several days to a few weeks. At certain times of the day your discomfort may be more intense.   Did you receive a nerve block?  A nerve block can provide pain relief for one hour to two days after your surgery. As long as the nerve block is working, you will experience little or no sensation in the area the surgeon operated on.  As the nerve block wears off, you will begin to experience pain or discomfort. It is very important that you begin taking your prescribed pain medication before the nerve block fully wears off. Treating your pain at the first sign of the block wearing off will ensure your pain is better controlled and more tolerable when full-sensation returns. Do not wait until the pain is intolerable, as the medicine will be less effective. It is better to treat pain in advance than to try and catch up.   General Anesthesia:  If you did not receive a nerve block during your surgery, you will need to start taking your pain medication shortly after your surgery and should continue  to do so as prescribed by your surgeon.     ICE AND ELEVATION: You may use ice for the first 48-72 hours, but it is not critical.   Motion of your fingers is very important to decrease the swelling.  Elevation, as much as possible for the next 48 hours, is critical for decreasing swelling as well as for pain relief. Elevation means when you are seated or lying down, you hand should be at or above your heart. When walking, the hand needs to be at or above the level of your elbow.  If the bandage gets too tight, it may need to be loosened. Please contact our office and we will instruct you in how to do this.    SURGICAL BANDAGES:  Keep your dressing and/or splint clean and dry at all times.  Do not remove until you are seen again in the office.  If careful, you may place a plastic bag over your bandage and tape the end to shower, but be careful, do not get your bandages wet.     HAND THERAPY:  You will be contacted to set up your first therapy visit.   ACTIVITY AND WORK: You are encouraged to move any fingers which are not in the bandage.  Light use of the fingers is allowed to assist the other hand with daily hygiene and eating, but strong gripping or lifting is often uncomfortable and should be avoided.  You might miss a variable period  of time from work and hopefully this issue has been discussed prior to surgery. You may not do any heavy work with your affected hand for about 2 weeks.    [facility] Second Floor, [address] [place], [place] [address] [phone]    Post Anesthesia Home Care Instructions  Activity: Get plenty of rest for the remainder of the day. A responsible individual must stay with you for 24 hours following the procedure.  For the next 24 hours, DO NOT: -Drive a car -Advertising copywriter -Drink alcoholic beverages -Take any medication unless instructed by your physician -Make any legal decisions or sign important papers.  Meals: Start with  liquid foods such as gelatin or soup. Progress to regular foods as tolerated. Avoid greasy, spicy, heavy foods. If nausea and/or vomiting occur, drink only clear liquids until the nausea and/or vomiting subsides. Call your physician if vomiting continues.  Special Instructions/Symptoms: Your throat may feel dry or sore from the anesthesia or the breathing tube placed in your throat during surgery. If this causes discomfort, gargle with warm salt water. The discomfort should disappear within 24 hours.  If you had a scopolamine patch placed behind your ear for the management of post- operative nausea and/or vomiting:  1. The medication in the patch is effective for 72 hours, after which it should be removed.  Wrap patch in a tissue and discard in the trash. Wash hands thoroughly with soap and water. 2. You may remove the patch earlier than 72 hours if you experience unpleasant side effects which may include dry mouth, dizziness or visual disturbances. 3. Avoid touching the patch. Wash your hands with soap and water after contact with the patch.    Next dose of tylenol  will be at 5:40pm   Regional Anesthesia Blocks  1. You may not be able to move or feel the blocked extremity after a regional anesthetic block. This may last may last from 3-48 hours after placement, but it will go away. The length of time depends on the medication injected and your individual response to the medication. As the nerves start to wake up, you may experience tingling as the movement and feeling returns to your extremity. If the numbness and inability to move your extremity has not gone away after 48 hours, please call your surgeon.   2. The extremity that is blocked will need to be protected until the numbness is gone and the strength has returned. Because you cannot feel it, you will need to take extra care to avoid injury. Because it may be weak, you may have difficulty moving it or using it. You may not know what  position it is in without looking at it while the block is in effect.  3. For blocks in the legs and feet, returning to weight bearing and walking needs to be done carefully. You will need to wait until the numbness is entirely gone and the strength has returned. You should be able to move your leg and foot normally before you try and bear weight or walk. You will need someone to be with you when you first try to ensure you do not fall and possibly risk injury.  4. Bruising and tenderness at the needle site are common side effects and will resolve in a few days.  5. Persistent numbness or new problems with movement should be communicated to the surgeon or the [facility] [phone] [facility] [phone]).

## 2024-10-07 NOTE — H&P (Signed)
 HAND SURGERY   HPI: Patient is a 58 y.o. female who presents with a laceration to the volar aspect of the right small finger at the level of the proximal phalanx with inability to flex at the DIP joint concerning for flexor tendon injury.  Patient denies any changes to their medical history or new systemic symptoms today.    Past Medical History:  Diagnosis Date   Dizziness 10/21/2015   Essential hypertension 10/21/2015   HSV (herpes simplex virus) infection    Hyperlipidemia 10/21/2015   Kidney calculi    Medication management 08/21/2022   Nausea 03/04/2023   Past Surgical History:  Procedure Laterality Date   TONSILLECTOMY     Social History   Socioeconomic History   Marital status: Married    Spouse name: Not on file   Number of children: Not on file   Years of education: Not on file   Highest education level: Bachelor's degree (e.g., BA, AB, BS)  Occupational History   Not on file  Tobacco Use   Smoking status: Former    Types: Cigarettes   Smokeless tobacco: Never  Vaping Use   Vaping status: Never Used  Substance and Sexual Activity   Alcohol use: Yes   Drug use: Not Currently   Sexual activity: Yes  Other Topics Concern   Not on file  Social History Narrative   Epworth Sleepiness Scale = 4 (as of 10/21/2015)   Social Drivers of Health   Financial Resource Strain: Low Risk  (06/08/2024)   Overall Financial Resource Strain (CARDIA)    Difficulty of Paying Living Expenses: Not hard at all  Food Insecurity: No Food Insecurity (06/08/2024)   Hunger Vital Sign    Worried About Running Out of Food in the Last Year: Never true    Ran Out of Food in the Last Year: Never true  Transportation Needs: No Transportation Needs (06/08/2024)   PRAPARE - Administrator, Civil Service (Medical): No    Lack of Transportation (Non-Medical): No  Physical Activity: Insufficiently Active (06/08/2024)   Exercise Vital Sign    Days of Exercise per Week: 3 days     Minutes of Exercise per Session: 40 min  Stress: No Stress Concern Present (06/08/2024)   [employer] of Occupational Health - Occupational Stress Questionnaire    Feeling of Stress : Only a little  Social Connections: Moderately Integrated (06/08/2024)   Social Connection and Isolation Panel    Frequency of Communication with Friends and Family: More than three times a week    Frequency of Social Gatherings with Friends and Family: Twice a week    Attends Religious Services: 1 to 4 times per year    Active Member of [employer] or Organizations: No    Attends Engineer, structural: Not on file    Marital Status: Married   Family History  Problem Relation Age of Onset   Atrial fibrillation Sister    CAD Brother    Breast cancer Neg Hx    BRCA 1/2 Neg Hx    - negative except otherwise stated in the family history section No Known Allergies Prior to Admission medications   Medication Sig Start Date End Date Taking? Authorizing Provider  amphetamine -dextroamphetamine  (ADDERALL) 20 MG tablet Take 1 tablet (20 mg total) by mouth 2 (two) times daily. 09/04/24  Yes Early, Sara E, NP  buPROPion  (WELLBUTRIN  XL) 300 MG 24 hr tablet Take 1 tablet (300 mg total) by mouth daily. 11/14/23  Yes Early,  Sara E, NP  fish oil-omega-3 fatty acids 1000 MG capsule Take 2 g by mouth daily.   Yes [provider]  Melatonin 5 MG CAPS Take 1 capsule by mouth at bedtime.   Yes [provider]  metoprolol  tartrate (LOPRESSOR ) 25 MG tablet Take 1 tablet (25 mg total) by mouth 2 (two) times daily. 11/14/23  Yes Early, Sara E, NP  tirzepatide  (ZEPBOUND ) 15 MG/0.5ML Pen Inject 15 mg into the skin once a week. 08/25/24  Yes Early, Sara E, NP  valACYclovir  (VALTREX ) 500 MG tablet Take 1 tablet (500 mg) by mouth 2 times daily as needed for 3 days 07/27/24  Yes Early, Sara E, NP  vitamin B-12 (CYANOCOBALAMIN ) 100 MCG tablet Take 1 tablet (100 mcg total) by mouth daily. 11/14/23  Yes Early, Sara E, NP   Vitamin D , Ergocalciferol , (DRISDOL ) 1.25 MG (50000 UNIT) CAPS capsule Take 1 capsule (50,000 Units total) by mouth every 7 (seven) days. 11/14/23  Yes Early, Sara E, NP  cyclobenzaprine  (FLEXERIL ) 10 MG tablet Take 1 tablet (10 mg total) by mouth 3 (three) times daily as needed for muscle spasms. 09/09/24   Early, Sara E, NP  fluticasone  (FLONASE ) 50 MCG/ACT nasal spray Place 2 sprays into both nostrils daily. Patient not taking: Reported on 06/09/2024 03/27/22   Maranda Jamee Jacob, MD  ketoconazole  (NIZORAL ) 2 % shampoo Apply 1 Application topically twice a week as directed 08/26/24     NONFORMULARY OR COMPOUNDED ITEM Topical testosterone  cream 1mg /0.18ml.  Apply topically 0.1ml topically three times weekly to thigh.  Disp: 3 month supply.  #1RF. 09/26/23   Oris Camie BRAVO, NP  NONFORMULARY OR COMPOUNDED ITEM Take 3 capsules by mouth daily.    [provider]  ondansetron  (ZOFRAN -ODT) 4 MG disintegrating tablet Dissolve 1 tablet (4 mg total) by mouth every 8 (eight) hours as needed for nausea or vomiting. 07/27/24   Early, Sara E, NP  Sunscreens (COPPERTONE COMPLETE SPF30) LOTN Apply topically daily to face and body 08/26/24   Elnor Longs, PA-C  tretinoin  (RETIN-A ) 0.05 % cream Apply topically at bedtime. Patient not taking: Reported on 06/09/2024 11/14/23   Oris Camie BRAVO, NP   No results found. - Positive ROS: All other systems have been reviewed and were otherwise negative with the exception of those mentioned in the HPI and as above.  Physical Exam: General: No acute distress, resting comfortably Cardiovascular: BUE warm and well perfused, normal rate Respiratory: Normal WOB on RA Skin: Warm and dry Neurologic: Sensation intact distally Psychiatric: Patient is at baseline mood and affect  Right upper Extremity  Dressing is clean and dry.  The fingertip is pink and well-perfused.  She has full and painless range of motion of remaining fingers.  Sensation is intact to light touch  throughout the remainder of the hand.  Assessment: 58 year old female with laceration to the volar aspect of the right small finger and no apparent FDP function concerning for tendon laceration  Plan: OR today for exploration of penetrating wound with likely repair of the FDP tendon.  We again reviewed the risks of surgery which include bleeding, infection, damage to neurovascular structures, persistent symptoms, stiffness, tendon rupture, need for additional surgery.   Bebe Galla, M.D. [facility] 11:26 AM

## 2024-10-07 NOTE — Anesthesia Preprocedure Evaluation (Addendum)
 Anesthesia Evaluation  Patient identified by MRN, date of birth, ID band Patient awake    Reviewed: Allergy & Precautions, H&P , NPO status , Patient's Chart, lab work & pertinent test results, reviewed documented beta blocker date and time   History of Anesthesia Complications Negative for: history of anesthetic complications  Airway Mallampati: II  TM Distance: >3 FB Neck ROM: Full    Dental no notable dental hx. (+) Teeth Intact, Dental Advisory Given   Pulmonary neg pulmonary ROS, former smoker   Pulmonary exam normal breath sounds clear to auscultation       Cardiovascular hypertension, Pt. on home beta blockers and Pt. on medications  Rhythm:Regular Rate:Normal     Neuro/Psych    Depression    negative neurological ROS     GI/Hepatic negative GI ROS, Neg liver ROS,,,  Endo/Other  negative endocrine ROS  On tirzepatide   Renal/GU negative Renal ROS  negative genitourinary   Musculoskeletal negative musculoskeletal ROS (+)    Abdominal   Peds  Hematology negative hematology ROS (+)   Anesthesia Other Findings Laceration of flexor tendon of right hand, initial encounter  Reproductive/Obstetrics negative OB ROS                              Anesthesia Physical Anesthesia Plan  ASA: 2  Anesthesia Plan: MAC and Regional   Post-op Pain Management: Tylenol  PO (pre-op)*   Induction:   PONV Risk Score and Plan: 2 and Treatment may vary due to age or medical condition, Propofol infusion, Ondansetron  and Midazolam  Airway Management Planned: Natural Airway and Simple Face Mask  Additional Equipment: None  Intra-op Plan:   Post-operative Plan:   Informed Consent: I have reviewed the patients History and Physical, chart, labs and discussed the procedure including the risks, benefits and alternatives for the proposed anesthesia with the patient or authorized representative who has  indicated his/her understanding and acceptance.     Dental advisory given  Plan Discussed with: CRNA  Anesthesia Plan Comments:          Anesthesia Quick Evaluation

## 2024-10-07 NOTE — Interval H&P Note (Signed)
 History and Physical Interval Note:  10/07/2024 11:33 AM  [name]  has presented today for surgery, with the diagnosis of Laceration of flexor tendon of right hand, initial encounter.  The various methods of treatment have been discussed with the patient and family. After consideration of risks, benefits and other options for treatment, the patient has consented to  Procedure(s): WOUND EXPLORATION (Right) REPAIR, TENDON, FLEXOR (Right) as a surgical intervention.  The patient's history has been reviewed, patient examined, no change in status, stable for surgery.  I have reviewed the patient's chart and labs.  Questions were answered to the patient's satisfaction.     Kylah Maresh

## 2024-10-07 NOTE — Progress Notes (Signed)
Assisted Dr. Edmond Fitzgerald with right, supraclavicular, ultrasound guided block. Side rails up, monitors on throughout procedure. See vital signs in flow sheet. Tolerated Procedure well. 

## 2024-10-07 NOTE — Anesthesia Postprocedure Evaluation (Signed)
 Anesthesia Post Note  Patient: [name]  Procedure(s) Performed: WOUND EXPLORATION (Right: Hand) REPAIR, TENDON, FLEXOR (Right: Hand)     Patient location during evaluation: PACU Anesthesia Type: Regional and MAC Level of consciousness: awake and alert Pain management: pain level controlled Vital Signs Assessment: post-procedure vital signs reviewed and stable Respiratory status: spontaneous breathing, nonlabored ventilation and respiratory function stable Cardiovascular status: stable and blood pressure returned to baseline Postop Assessment: no apparent nausea or vomiting Anesthetic complications: no   No notable events documented.  Last Vitals:  Vitals:   10/07/24 1445 10/07/24 1456  BP: (!) 151/77 (!) 154/66  Pulse: 67 65  Resp: 20 15  Temp:    SpO2: 99% 99%    Last Pain:  Vitals:   10/07/24 1510  TempSrc:   PainSc: 0-No pain                 Janely Gullickson,W. EDMOND

## 2024-10-07 NOTE — Anesthesia Procedure Notes (Signed)
 Anesthesia Regional Block: Supraclavicular block   Pre-Anesthetic Checklist: , timeout performed,  Correct Patient, Correct Site, Correct Laterality,  Correct Procedure, Correct Position, site marked,  Risks and benefits discussed,  Pre-op evaluation,  At surgeon's request and post-op pain management  Laterality: Right  Prep: Maximum Sterile Barrier Precautions used, chloraprep       Needles:  Injection technique: Single-shot  Needle Type: Echogenic Stimulator Needle     Needle Length: 5cm  Needle Gauge: 22     Additional Needles:   Procedures:,,,, ultrasound used (permanent image in chart),,    Narrative:  Start time: 10/07/2024 11:43 AM End time: 10/07/2024 11:53 AM Injection made incrementally with aspirations every 5 mL.  Performed by: Personally  Anesthesiologist: Epifanio Fallow, MD

## 2024-10-07 NOTE — Op Note (Signed)
 Date of Surgery: 10/07/2024  INDICATIONS: Patient is a 58 y.o.-year-old female with a laceration to the volar aspect of the right small finger at the level of the PIP flexion crease.  She has no active DIP flexion concerning for FDP laceration.  Risks, benefits, and alternatives to surgery were again discussed with the patient in the preoperative area. The patient wishes to proceed with surgery.  Informed consent was signed after our discussion.   PREOPERATIVE DIAGNOSIS:  Right small finger laceration  POSTOPERATIVE DIAGNOSIS:  Right small finger laceration, approx 1 cm in length Right small finger FDP laceration in zone 2  PROCEDURE:  Exploration of penetrating wound to right small finger Right small finger FDP repair in zone 2   SURGEON: Carlin Galla, M.D.  ASSIST: None  ANESTHESIA:  Regional, MAC  IV FLUIDS AND URINE: See anesthesia.  ESTIMATED BLOOD LOSS: 5 mL.  IMPLANTS: * No implants in log *   DRAINS: None  COMPLICATIONS: None  DESCRIPTION OF PROCEDURE: The patient was met in the preoperative holding area where the surgical site was marked and the consent form was signed.  The patient was then taken to the operating room and remained on the stretcher.  All bony prominences were well padded.  A hand table was placed adjacent to the right upper extremity. A tourniquet was applied to the right upper arm.  Monitored sedation was induced.  Preoperative antibiotics were given.  The operative extremity was prepped and draped in the usual and sterile fashion.  A formal time-out was performed to confirm that this was the correct patient, surgery, side, and site.   Following formal timeout, the limb was exsanguinated with an Esmarch bandage and tourniquet inflated to 250 mmHg.  She had a transverse laceration to the volar aspect of the small finger at the PIP flexion crease.  This laceration was extended both proximally and distally.  Full-thickness skin flaps were elevated.   Careful longitudinal blunt dissection was used to identify the radial and ulnar neurovascular bundles.  This was protected throughout the procedure.  The A4 pulley was identified and and found to be empty.  The FDS was intact.  I then extended the incision distally from the level of the DIP flexion crease to the pulp of the finger.  The FDP tendon was found at the level of the distal A4/A5 pulley.  This the distal portion of the A4 pulley and A5 pulley was released to allow for exposure of the profundus stump for later repair.  I then attempted to retrieve the FDP tendon through the pulley system using a tendon passer.  This was unsuccessful.  I then extended the incision proximally over the A1 pulley.  The A1 pulley was identified and a portion was released.  The FDP was identified at this level.  Using to pickups, I was able to feel the profundus tendon through the pulley system to the level of the middle phalanx.  A 25-gauge needle passed through the A2 pulley was used to hold the tendon stump in the appropriate position.  I then used a 6-0 Prolene suture in figure-of-eight fashion to hold the tendon ends in appropriate orientation.  I then repaired the tendon using two 4-0 supramid sutures in a 6-strand M-Tang configuration.  Several more 6-0 Prolene sutures were used in a figure-of-eight fashion to smooth the tendon ends.  Ultimately, I had to vent the entire A4 pulley to prevent impingement upon the tendon repair.  There was no gapping at the  repair site with full passive finger extension.  The wound was then thoroughly irrigated with copious sterile saline.  It was closed using a 4-0 Vicryl Rapide suture in combination of simple interrupted and horizontal mattress fashion.  The wound was then cleaned and dressed with Xeroform, bacitracin, sterile 4 x 4 gauze, and cast padding.  The tourniquet was deflated.  The fingertips were pink and well-perfused.  A well-padded dorsal blocking ulnar gutter splint was  applied.  The patient was reversed from sedation.  All counts were correct x 2 at the end of the procedure.  The patient was then taken to the PACU in stable condition.   POSTOPERATIVE PLAN: She will be discharged to home with appropriate pain medication and discharge instructions.  A referral has been placed to hand therapy which will begin next week.  I'll see her in the office in 10-14 days for her first postop visit.   Carlin Galla, MD 2:39 PM

## 2024-10-08 ENCOUNTER — Encounter (HOSPITAL_BASED_OUTPATIENT_CLINIC_OR_DEPARTMENT_OTHER): Payer: Self-pay | Admitting: Orthopedic Surgery

## 2024-10-15 DIAGNOSIS — M79641 Pain in right hand: Secondary | ICD-10-CM | POA: Diagnosis not present

## 2024-10-22 DIAGNOSIS — M79641 Pain in right hand: Secondary | ICD-10-CM | POA: Diagnosis not present

## 2024-10-23 ENCOUNTER — Other Ambulatory Visit (HOSPITAL_COMMUNITY): Payer: Self-pay

## 2024-10-23 MED ORDER — OXYCODONE HCL 5 MG PO TABS
5.0000 mg | ORAL_TABLET | Freq: Four times a day (QID) | ORAL | 0 refills | Status: AC | PRN
  Filled 2024-10-23: qty 20, 5d supply, fill #0

## 2024-10-26 ENCOUNTER — Other Ambulatory Visit: Payer: Self-pay | Admitting: Nurse Practitioner

## 2024-10-27 ENCOUNTER — Other Ambulatory Visit (HOSPITAL_COMMUNITY): Payer: Self-pay

## 2024-10-27 ENCOUNTER — Encounter: Payer: Self-pay | Admitting: Nurse Practitioner

## 2024-10-27 ENCOUNTER — Other Ambulatory Visit: Payer: Self-pay

## 2024-10-27 DIAGNOSIS — F902 Attention-deficit hyperactivity disorder, combined type: Secondary | ICD-10-CM

## 2024-10-27 NOTE — Telephone Encounter (Signed)
 Last apt 06/09/24

## 2024-10-28 ENCOUNTER — Other Ambulatory Visit: Payer: Self-pay

## 2024-10-28 MED ORDER — AMPHETAMINE-DEXTROAMPHETAMINE 20 MG PO TABS
20.0000 mg | ORAL_TABLET | Freq: Two times a day (BID) | ORAL | 0 refills | Status: DC
  Filled 2024-10-28: qty 60, 30d supply, fill #0

## 2024-10-29 ENCOUNTER — Other Ambulatory Visit (HOSPITAL_COMMUNITY): Payer: Self-pay

## 2024-10-29 DIAGNOSIS — M79641 Pain in right hand: Secondary | ICD-10-CM | POA: Diagnosis not present

## 2024-11-11 DIAGNOSIS — M79641 Pain in right hand: Secondary | ICD-10-CM | POA: Diagnosis not present

## 2024-11-13 ENCOUNTER — Other Ambulatory Visit (HOSPITAL_COMMUNITY): Payer: Self-pay

## 2024-11-13 ENCOUNTER — Telehealth: Admitting: Physician Assistant

## 2024-11-13 DIAGNOSIS — R3989 Other symptoms and signs involving the genitourinary system: Secondary | ICD-10-CM

## 2024-11-13 MED ORDER — NITROFURANTOIN MONOHYD MACRO 100 MG PO CAPS
100.0000 mg | ORAL_CAPSULE | Freq: Two times a day (BID) | ORAL | 0 refills | Status: AC
  Filled 2024-11-13: qty 14, 7d supply, fill #0

## 2024-11-13 MED ORDER — PHENAZOPYRIDINE HCL 100 MG PO TABS
100.0000 mg | ORAL_TABLET | Freq: Three times a day (TID) | ORAL | 0 refills | Status: AC
  Filled 2024-11-13: qty 6, 2d supply, fill #0

## 2024-11-13 NOTE — Patient Instructions (Signed)
 [name], thank you for joining Tara Mcinerny, PA-C for today's virtual visit.  While this provider is not your primary care provider (PCP), if your PCP is located in our provider database this encounter information will be shared with them immediately following your visit.   A [facility] MyChart account gives you access to today's visit and all your visits, tests, and labs performed at [facility]  click here if you don't have a [facility] MyChart account or go to [email or web address]  Consent: (Patient) [name] provided verbal consent for this virtual visit at the beginning of the encounter.  Current Medications:  Current Outpatient Medications:    amphetamine -dextroamphetamine  (ADDERALL) 20 MG tablet, Take 1 tablet (20 mg total) by mouth 2 (two) times daily., Disp: 60 tablet, Rfl: 0   buPROPion  (WELLBUTRIN  XL) 300 MG 24 hr tablet, Take 1 tablet (300 mg total) by mouth daily., Disp: 90 tablet, Rfl: 3   cyclobenzaprine  (FLEXERIL ) 10 MG tablet, Take 1 tablet (10 mg total) by mouth 3 (three) times daily as needed for muscle spasms., Disp: 30 tablet, Rfl: 1   fish oil-omega-3 fatty acids 1000 MG capsule, Take 2 g by mouth daily., Disp: , Rfl:    fluticasone  (FLONASE ) 50 MCG/ACT nasal spray, Place 2 sprays into both nostrils daily. (Patient not taking: Reported on 06/09/2024), Disp: 16 g, Rfl: 0   ketoconazole  (NIZORAL ) 2 % shampoo, Apply 1 Application topically twice a week as directed, Disp: 120 mL, Rfl: 2   Melatonin 5 MG CAPS, Take 1 capsule by mouth at bedtime., Disp: , Rfl:    metoprolol  tartrate (LOPRESSOR ) 25 MG tablet, Take 1 tablet (25 mg total) by mouth 2 (two) times daily., Disp: 180 tablet, Rfl: 3   NONFORMULARY OR COMPOUNDED ITEM, Topical testosterone  cream 1mg /0.71ml.  Apply topically 0.1ml topically three times weekly to thigh.  Disp: 3 month supply.  #1RF., Disp: 1 each, Rfl: 1   NONFORMULARY OR COMPOUNDED ITEM, Take 3 capsules by  mouth daily., Disp: , Rfl:    ondansetron  (ZOFRAN -ODT) 4 MG disintegrating tablet, Dissolve 1 tablet (4 mg total) by mouth every 8 (eight) hours as needed for nausea or vomiting., Disp: 20 tablet, Rfl: 2   Sunscreens (COPPERTONE COMPLETE SPF30) LOTN, Apply topically daily to face and body, Disp: 207 mL, Rfl: 12   tirzepatide  (ZEPBOUND ) 15 MG/0.5ML Pen, Inject 15 mg into the skin once a week., Disp: 2 mL, Rfl: 3   tretinoin  (RETIN-A ) 0.05 % cream, Apply topically at bedtime. (Patient not taking: Reported on 06/09/2024), Disp: 45 g, Rfl: 6   valACYclovir  (VALTREX ) 500 MG tablet, Take 1 tablet (500 mg) by mouth 2 times daily as needed for 3 days, Disp: 90 tablet, Rfl: 1   vitamin B-12 (CYANOCOBALAMIN ) 100 MCG tablet, Take 1 tablet (100 mcg total) by mouth daily., Disp: 100 tablet, Rfl: 3   Vitamin D , Ergocalciferol , (DRISDOL ) 1.25 MG (50000 UNIT) CAPS capsule, Take 1 capsule (50,000 Units total) by mouth every 7 (seven) days., Disp: 12 capsule, Rfl: 3   Medications ordered in this encounter:  No orders of the defined types were placed in this encounter.    *If you need refills on other medications prior to your next appointment, please contact your pharmacy*  Follow-Up: Call back or seek an in-person evaluation if the symptoms worsen or if the condition fails to improve as anticipated.   Virtual Care [phone]  Other Instructions    If you have been instructed to have an in-person  evaluation today at a local Urgent Care facility, please use the link below. It will take you to a list of all of our available [facility], including address, phone number and hours of operation. Please do not delay care.  [facility]  If you or a family member do not have a primary care provider, use the link below to schedule a visit and establish care. When you choose a [facility] primary care physician or advanced practice provider, you gain a long-term partner in  health. Find a Primary Care Provider  Learn more about [facility] in-office and virtual care options: [facility] - Get Care Now

## 2024-11-13 NOTE — Progress Notes (Signed)
 Virtual Visit Consent   [name], you are scheduled for a virtual visit with a [facility] provider today. Just as with appointments in the office, your consent must be obtained to participate. Your consent will be active for this visit and any virtual visit you may have with one of our providers in the next 365 days. If you have a MyChart account, a copy of this consent Tara be sent to you electronically.  As this is a virtual visit, video technology does not allow for your provider to perform a traditional examination. This may limit your provider's ability to fully assess your condition. If your provider identifies any concerns that need to be evaluated in person or the need to arrange testing (such as labs, EKG, etc.), we will make arrangements to do so. Although advances in technology are sophisticated, we cannot ensure that it will always work on either your end or our end. If the connection with a video visit is poor, the visit may have to be switched to a telephone visit. With either a video or telephone visit, we are not always able to ensure that we have a secure connection.  By engaging in this virtual visit, you consent to the provision of healthcare and authorize for your insurance to be billed (if applicable) for the services provided during this visit. Depending on your insurance coverage, you may receive a charge related to this service.  I need to obtain your verbal consent now. Are you willing to proceed with your visit today? [name] has provided verbal consent on 11/13/2024 for a virtual visit (video or telephone). Tara Martus, Reed  Date: 11/13/2024 9:10 AM   Virtual Visit via Video Note   I, [name], connected with  [name]  ([id number], 07/16/1966) on 11/13/24 at  9:00 AM EST by a video-enabled telemedicine application and verified that I am speaking with the correct person using two identifiers.  Location: Patient: Virtual  Visit Location Patient: Home Provider: Virtual Visit Location Provider: Home Office   I discussed the limitations of evaluation and management by telemedicine and the availability of in person appointments. The patient expressed understanding and agreed to proceed.    History of Present Illness: [name] is a 58 y.o. who identifies as a female who was assigned female at birth, and is being seen today for possible UTI.  HPI: Reports onset of UTI symptoms last night.  She reports dysuria, urgency, frequency and hesitancy.  She also noticed some mild blood in the urine as well.  Patient does report history of kidney stones in the past, at this time she is not complaining of any flank pain.  She denies any vomiting, fever, chills, back pain.  She has had UTIs in the past, no recent infections.  She denies any history of complicated UTIs.  Patient reports no other concerns at this time.      Problems:  Patient Active Problem List   Diagnosis Date Noted   Attention deficit hyperactivity disorder (ADHD), combined type 06/17/2024   Skin sensitivity 06/17/2024   Hair loss 06/17/2024   Hypertensive retinopathy, bilateral 06/09/2024   Recurrent major depressive disorder, in partial remission 06/09/2024   Acute bilateral low back pain without sciatica 02/24/2024   Flu-like symptoms 02/24/2024   Paresthesia 02/24/2024   History of kidney stones 11/24/2023   Cervical muscle pain 11/24/2023   Melasma 11/24/2023   Stable branch retinal vein occlusion of left eye (HCC) 11/14/2023   Hormone  replacement therapy (postmenopausal) 05/09/2023   Testosterone  deficiency 03/04/2023   Vitamin D  deficiency 02/07/2023   Low libido 02/07/2023   Heberden's nodes of both hands 02/07/2023   Pre-diabetes 09/29/2022   Encounter for annual physical exam 08/21/2022   BMI 36.0-36.9,adult 08/21/2022   Essential hypertension 10/21/2015   Hyperlipidemia 10/21/2015    Allergies: No Known  Allergies Medications:  Current Outpatient Medications:    amphetamine -dextroamphetamine  (ADDERALL) 20 MG tablet, Take 1 tablet (20 mg total) by mouth 2 (two) times daily., Disp: 60 tablet, Rfl: 0   buPROPion  (WELLBUTRIN  XL) 300 MG 24 hr tablet, Take 1 tablet (300 mg total) by mouth daily., Disp: 90 tablet, Rfl: 3   cyclobenzaprine  (FLEXERIL ) 10 MG tablet, Take 1 tablet (10 mg total) by mouth 3 (three) times daily as needed for muscle spasms., Disp: 30 tablet, Rfl: 1   fish oil-omega-3 fatty acids 1000 MG capsule, Take 2 g by mouth daily., Disp: , Rfl:    fluticasone  (FLONASE ) 50 MCG/ACT nasal spray, Place 2 sprays into both nostrils daily. (Patient not taking: Reported on 06/09/2024), Disp: 16 g, Rfl: 0   ketoconazole  (NIZORAL ) 2 % shampoo, Apply 1 Application topically twice a week as directed, Disp: 120 mL, Rfl: 2   Melatonin 5 MG CAPS, Take 1 capsule by mouth at bedtime., Disp: , Rfl:    metoprolol  tartrate (LOPRESSOR ) 25 MG tablet, Take 1 tablet (25 mg total) by mouth 2 (two) times daily., Disp: 180 tablet, Rfl: 3   NONFORMULARY OR COMPOUNDED ITEM, Topical testosterone  cream 1mg /0.50ml.  Apply topically 0.1ml topically three times weekly to thigh.  Disp: 3 month supply.  #1RF., Disp: 1 each, Rfl: 1   NONFORMULARY OR COMPOUNDED ITEM, Take 3 capsules by mouth daily., Disp: , Rfl:    ondansetron  (ZOFRAN -ODT) 4 MG disintegrating tablet, Dissolve 1 tablet (4 mg total) by mouth every 8 (eight) hours as needed for nausea or vomiting., Disp: 20 tablet, Rfl: 2   Sunscreens (COPPERTONE COMPLETE SPF30) LOTN, Apply topically daily to face and body, Disp: 207 mL, Rfl: 12   tirzepatide  (ZEPBOUND ) 15 MG/0.5ML Pen, Inject 15 mg into the skin once a week., Disp: 2 mL, Rfl: 3   tretinoin  (RETIN-A ) 0.05 % cream, Apply topically at bedtime. (Patient not taking: Reported on 06/09/2024), Disp: 45 g, Rfl: 6   valACYclovir  (VALTREX ) 500 MG tablet, Take 1 tablet (500 mg) by mouth 2 times daily as needed for 3 days, Disp:  90 tablet, Rfl: 1   vitamin B-12 (CYANOCOBALAMIN ) 100 MCG tablet, Take 1 tablet (100 mcg total) by mouth daily., Disp: 100 tablet, Rfl: 3   Vitamin D , Ergocalciferol , (DRISDOL ) 1.25 MG (50000 UNIT) CAPS capsule, Take 1 capsule (50,000 Units total) by mouth every 7 (seven) days., Disp: 12 capsule, Rfl: 3  Observations/Objective: Patient is well-developed, well-nourished in no acute distress.  Resting comfortably  at home.  Head is normocephalic, atraumatic.  No labored breathing.  Speech is clear and coherent with logical content.  Patient is alert and oriented at baseline.  No tenderness to palpation reported per patient over the abdomen  Assessment and Plan: 1. Suspected UTI (Primary)   Follow Up Instructions: I discussed the assessment and treatment plan with the patient. The patient was provided an opportunity to ask questions and all were answered. The patient agreed with the plan and demonstrated an understanding of the instructions.  A copy of instructions were sent to the patient via MyChart unless otherwise noted below.   The patient was advised to call back  or seek an in-person evaluation if the symptoms worsen or if the condition fails to improve as anticipated.    Arnitra Sokoloski, Reed

## 2024-11-14 ENCOUNTER — Other Ambulatory Visit (HOSPITAL_BASED_OUTPATIENT_CLINIC_OR_DEPARTMENT_OTHER): Payer: Self-pay

## 2024-11-14 ENCOUNTER — Other Ambulatory Visit: Payer: Self-pay

## 2024-11-14 ENCOUNTER — Ambulatory Visit
Admission: RE | Admit: 2024-11-14 | Discharge: 2024-11-14 | Disposition: A | Discharge status: Home / Self Care | Source: Ambulatory Visit | Attending: Family Medicine | Admitting: Family Medicine

## 2024-11-14 VITALS — BP 123/80 | HR 80 | Temp 97.2°F

## 2024-11-14 DIAGNOSIS — N3001 Acute cystitis with hematuria: Secondary | ICD-10-CM | POA: Diagnosis not present

## 2024-11-14 LAB — POCT URINE DIPSTICK
Bilirubin, UA: NEGATIVE
Glucose, UA: NEGATIVE mg/dL
Ketones, POC UA: NEGATIVE mg/dL
Leukocytes, UA: NEGATIVE
Nitrite, UA: POSITIVE — AB
POC PROTEIN,UA: 100 — AB
Spec Grav, UA: 1.01 (ref 1.010–1.025)
Urobilinogen, UA: 0.2 U/dL
pH, UA: 5.5 (ref 5.0–8.0)

## 2024-11-14 MED ORDER — CEFDINIR 300 MG PO CAPS
300.0000 mg | ORAL_CAPSULE | Freq: Two times a day (BID) | ORAL | 0 refills | Status: AC
  Filled 2024-11-14: qty 14, 7d supply, fill #0

## 2024-11-14 NOTE — Discharge Instructions (Addendum)
 Advised patient to discontinue Macrobid  and Pyridium.  Advised to take send to the ER with food to completion.  Encouraged increase daily water intake to 64 ounces per day while taking this medication.  Advised we will follow-up with urine culture results once received.  Advised if symptoms worsen and/or unresolved please follow-up with your PCP, [facility], or here for further evaluation.

## 2024-11-14 NOTE — ED Provider Notes (Signed)
 [name]    CSN: [id number] Arrival date & time: 11/14/24  0845      History   Chief Complaint Chief Complaint  Patient presents with   Abdominal Pain    Televisit yesterday for UTI symptoms - put on macrobid  & pyridium.  Pain worse in abdominal area, very painful urinating.  If any urine gets out, there are clumps of blood in it. Chills. No flank pain. - Entered by patient   Dysuria    HPI [name] is a 58 y.o. female.   HPI 58 year old female presents with.  PMH significant for HTN, kidney calculi and HLD.  Past Medical History:  Diagnosis Date   Dizziness 10/21/2015   Essential hypertension 10/21/2015   HSV (herpes simplex virus) infection    Hyperlipidemia 10/21/2015   Kidney calculi    Medication management 08/21/2022   Nausea 03/04/2023    Patient Active Problem List   Diagnosis Date Noted   Attention deficit hyperactivity disorder (ADHD), combined type 06/17/2024   Skin sensitivity 06/17/2024   Hair loss 06/17/2024   Hypertensive retinopathy, bilateral 06/09/2024   Recurrent major depressive disorder, in partial remission 06/09/2024   Acute bilateral low back pain without sciatica 02/24/2024   Flu-like symptoms 02/24/2024   Paresthesia 02/24/2024   History of kidney stones 11/24/2023   Cervical muscle pain 11/24/2023   Melasma 11/24/2023   Stable branch retinal vein occlusion of left eye (HCC) 11/14/2023   Hormone replacement therapy (postmenopausal) 05/09/2023   Testosterone  deficiency 03/04/2023   Vitamin D  deficiency 02/07/2023   Low libido 02/07/2023   Heberden's nodes of both hands 02/07/2023   Pre-diabetes 09/29/2022   Encounter for annual physical exam 08/21/2022   BMI 36.0-36.9,adult 08/21/2022   Essential hypertension 10/21/2015   Hyperlipidemia 10/21/2015    Past Surgical History:  Procedure Laterality Date   FLEXOR TENDON REPAIR Right 10/07/2024   Procedure: REPAIR, TENDON, FLEXOR;  Surgeon: Romona Harari, MD;  Location: [facility];  Service: Orthopedics;  Laterality: Right;   TONSILLECTOMY     WOUND EXPLORATION Right 10/07/2024   Procedure: WOUND EXPLORATION;  Surgeon: Romona Harari, MD;  Location: [facility];  Service: Orthopedics;  Laterality: Right;    OB History     Gravida  2   Para      Term      Preterm      AB  2   Living         SAB  1   IAB  1   Ectopic      Multiple      Live Births               Home Medications    Prior to Admission medications   Medication Sig Start Date End Date Taking? Authorizing Provider  cefdinir (OMNICEF) 300 MG capsule Take 1 capsule (300 mg total) by mouth 2 (two) times daily for 7 days. 11/14/24 11/21/24 Yes Teddy Sharper, FNP  amphetamine -dextroamphetamine  (ADDERALL) 20 MG tablet Take 1 tablet (20 mg total) by mouth 2 (two) times daily. 10/28/24   Early, Sara E, NP  buPROPion  (WELLBUTRIN  XL) 300 MG 24 hr tablet Take 1 tablet (300 mg total) by mouth daily. 11/14/23   Early, Sara E, NP  cyclobenzaprine  (FLEXERIL ) 10 MG tablet Take 1 tablet (10 mg total) by mouth 3 (three) times daily as needed for muscle spasms. 09/09/24   Early, Sara E, NP  fish oil-omega-3 fatty acids 1000 MG capsule Take 2  g by mouth daily.    [provider]  fluticasone  (FLONASE ) 50 MCG/ACT nasal spray Place 2 sprays into both nostrils daily. Patient not taking: Reported on 06/09/2024 03/27/22   Maranda Jamee Jacob, MD  ketoconazole  (NIZORAL ) 2 % shampoo Apply 1 Application topically twice a week as directed 08/26/24     Melatonin 5 MG CAPS Take 1 capsule by mouth at bedtime.    [provider]  metoprolol  tartrate (LOPRESSOR ) 25 MG tablet Take 1 tablet (25 mg total) by mouth 2 (two) times daily. 11/14/23   Early, Sara E, NP  nitrofurantoin , macrocrystal-monohydrate, (MACROBID ) 100 MG capsule Take 1 capsule (100 mg total) by mouth 2 (two) times daily for 7 days. 11/13/24 11/20/24  Zohra, Mobeen, PA-C   NONFORMULARY OR COMPOUNDED ITEM Topical testosterone  cream 1mg /0.71ml.  Apply topically 0.1ml topically three times weekly to thigh.  Disp: 3 month supply.  #1RF. 09/26/23   Oris Camie BRAVO, NP  NONFORMULARY OR COMPOUNDED ITEM Take 3 capsules by mouth daily.    [provider]  ondansetron  (ZOFRAN -ODT) 4 MG disintegrating tablet Dissolve 1 tablet (4 mg total) by mouth every 8 (eight) hours as needed for nausea or vomiting. 07/27/24   Early, Sara E, NP  phenazopyridine (PYRIDIUM) 100 MG tablet Take 1 tablet (100 mg total) by mouth 3 (three) times daily for 2 days. 11/13/24 11/15/24  Zohra, Mobeen, PA-C  Sunscreens (COPPERTONE COMPLETE SPF30) LOTN Apply topically daily to face and body 08/26/24   Elnor Longs, PA-C  tirzepatide  (ZEPBOUND ) 15 MG/0.5ML Pen Inject 15 mg into the skin once a week. 08/25/24   Early, Sara E, NP  tretinoin  (RETIN-A ) 0.05 % cream Apply topically at bedtime. Patient not taking: Reported on 06/09/2024 11/14/23   Early, Sara E, NP  valACYclovir  (VALTREX ) 500 MG tablet Take 1 tablet (500 mg) by mouth 2 times daily as needed for 3 days 07/27/24   Early, Sara E, NP  vitamin B-12 (CYANOCOBALAMIN ) 100 MCG tablet Take 1 tablet (100 mcg total) by mouth daily. 11/14/23   Early, Sara E, NP  Vitamin D , Ergocalciferol , (DRISDOL ) 1.25 MG (50000 UNIT) CAPS capsule Take 1 capsule (50,000 Units total) by mouth every 7 (seven) days. 11/14/23   EarlyCamie BRAVO, NP    Family History Family History  Problem Relation Age of Onset   Atrial fibrillation Sister    CAD Brother    Breast cancer Neg Hx    BRCA 1/2 Neg Hx     Social History Social History   Tobacco Use   Smoking status: Former    Types: Cigarettes   Smokeless tobacco: Never  Vaping Use   Vaping status: Never Used  Substance Use Topics   Alcohol use: Yes   Drug use: Not Currently     Allergies   Patient has no known allergies.   Review of Systems Review of Systems   Physical Exam Triage Vital Signs ED Triage  Vitals [11/14/24 0908]  Encounter Vitals Group     BP 123/80     Girls Systolic BP Percentile      Girls Diastolic BP Percentile      Boys Systolic BP Percentile      Boys Diastolic BP Percentile      Pulse Rate 80     Resp      Temp (!) 97.2 F (36.2 C)     Temp Source Oral     SpO2 97 %     Weight      Height  Head Circumference      Peak Flow      Pain Score      Pain Loc      Pain Education      Exclude from Growth Chart    No data found.  Updated Vital Signs BP 123/80 (BP Location: Right Arm)   Pulse 80   Temp (!) 97.2 F (36.2 C) (Oral)   LMP  (LMP Unknown)   SpO2 97%   Visual Acuity Right Eye Distance:   Left Eye Distance:   Bilateral Distance:    Right Eye Near:   Left Eye Near:    Bilateral Near:     Physical Exam Vitals and nursing note reviewed.  Constitutional:      General: She is not in acute distress.    Appearance: She is well-developed and normal weight. She is not ill-appearing.  HENT:     Head: Normocephalic and atraumatic.     Mouth/Throat:     Mouth: Mucous membranes are moist.     Pharynx: Oropharynx is clear.  Eyes:     Extraocular Movements: Extraocular movements intact.     Pupils: Pupils are equal, round, and reactive to light.  Cardiovascular:     Rate and Rhythm: Normal rate and regular rhythm.     Heart sounds: Normal heart sounds. No murmur heard. Pulmonary:     Effort: Pulmonary effort is normal.     Breath sounds: Normal breath sounds. No wheezing, rhonchi or rales.  Abdominal:     General: Abdomen is flat. Bowel sounds are normal. There is no distension or abdominal bruit.     Palpations: There is no shifting dullness, fluid wave, hepatomegaly, splenomegaly, mass or pulsatile mass.     Tenderness: There is abdominal tenderness in the right lower quadrant and left lower quadrant. There is no right CVA tenderness, left CVA tenderness, guarding or rebound. Negative signs include Murphy's sign and McBurney's sign.      Hernia: No hernia is present.  Skin:    General: Skin is warm and dry.  Neurological:     General: No focal deficit present.     Mental Status: She is alert and oriented to person, place, and time.  Psychiatric:        Mood and Affect: Mood normal.        Behavior: Behavior normal.      UC Treatments / Results  Labs (all labs ordered are listed, but only abnormal results are displayed) Labs Reviewed  POCT URINE DIPSTICK - Abnormal; Notable for the following components:      Result Value   Color, UA other (*)    Clarity, UA cloudy (*)    Blood, UA large (*)    POC PROTEIN,UA =100 (*)    Nitrite, UA Positive (*)    All other components within normal limits  URINE CULTURE    EKG   Radiology No results found.  Procedures Procedures (including critical care time)  Medications Ordered in UC Medications - No data to display  Initial Impression / Assessment and Plan / UC Course  I have reviewed the triage vital signs and the nursing notes.  Pertinent labs & imaging results that were available during my care of the patient were reviewed by me and considered in my medical decision making (see chart for details).     MDM: 1.  Acute cystitis with hematuria-UA revealed above, urine culture ordered, Rx'd cefdinir 300 mg capsule: Take 1 capsule twice daily x 7  days. Advised patient to discontinue Macrobid  and Pyridium.  Advised to take send to the ER with food to completion.  Encouraged increase daily water intake to 64 ounces per day while taking this medication.  Advised we will follow-up with urine culture results once received.  Advised if symptoms worsen and/or unresolved please follow-up with your PCP, [facility], or here for further evaluation.  Patient discharged home, hemodynamically stable. Final Clinical Impressions(s) / UC Diagnoses   Final diagnoses:  Acute cystitis with hematuria     Discharge Instructions      Advised patient to discontinue Macrobid   and Pyridium.  Advised to take send to the ER with food to completion.  Encouraged increase daily water intake to 64 ounces per day while taking this medication.  Advised we will follow-up with urine culture results once received.  Advised if symptoms worsen and/or unresolved please follow-up with your PCP, [facility], or here for further evaluation.     ED Prescriptions     Medication Sig Dispense Auth. Provider   cefdinir (OMNICEF) 300 MG capsule Take 1 capsule (300 mg total) by mouth 2 (two) times daily for 7 days. 14 capsule Fielding Mault, FNP      PDMP not reviewed this encounter.   Teddy Sharper, FNP 11/14/24 [id number]

## 2024-11-14 NOTE — ED Triage Notes (Signed)
 Pt c/o dysuria and abd pain x a few weeks. Sxs worsened in last couple days. Televisit yesterday. Rx'd macrobid  and pyridium. Not improvements in sxs. Some hematuria as well.

## 2024-11-16 ENCOUNTER — Encounter: Payer: Self-pay | Admitting: Nurse Practitioner

## 2024-11-16 DIAGNOSIS — R3911 Hesitancy of micturition: Secondary | ICD-10-CM

## 2024-11-16 LAB — URINE CULTURE: Culture: NO GROWTH

## 2024-11-16 MED ORDER — TAMSULOSIN HCL 0.4 MG PO CAPS
0.4000 mg | ORAL_CAPSULE | Freq: Every day | ORAL | 3 refills | Status: AC
  Filled 2024-11-16: qty 30, 30d supply, fill #0

## 2024-11-17 ENCOUNTER — Other Ambulatory Visit (HOSPITAL_COMMUNITY): Payer: Self-pay

## 2024-11-19 DIAGNOSIS — M79641 Pain in right hand: Secondary | ICD-10-CM | POA: Diagnosis not present

## 2024-11-23 DIAGNOSIS — M79641 Pain in right hand: Secondary | ICD-10-CM | POA: Diagnosis not present

## 2024-11-24 ENCOUNTER — Other Ambulatory Visit: Payer: Self-pay

## 2024-11-24 ENCOUNTER — Other Ambulatory Visit: Payer: Self-pay | Admitting: Nurse Practitioner

## 2024-11-24 ENCOUNTER — Other Ambulatory Visit (HOSPITAL_COMMUNITY): Payer: Self-pay

## 2024-11-24 DIAGNOSIS — Z6836 Body mass index (BMI) 36.0-36.9, adult: Secondary | ICD-10-CM

## 2024-11-24 DIAGNOSIS — I10 Essential (primary) hypertension: Secondary | ICD-10-CM

## 2024-11-24 DIAGNOSIS — R7303 Prediabetes: Secondary | ICD-10-CM

## 2024-11-24 DIAGNOSIS — E782 Mixed hyperlipidemia: Secondary | ICD-10-CM

## 2024-11-24 MED ORDER — BUPROPION HCL ER (XL) 300 MG PO TB24
300.0000 mg | ORAL_TABLET | Freq: Every day | ORAL | 0 refills | Status: AC
  Filled 2024-11-24: qty 90, 90d supply, fill #0

## 2024-11-25 ENCOUNTER — Other Ambulatory Visit (HOSPITAL_COMMUNITY): Payer: Self-pay

## 2024-11-25 ENCOUNTER — Other Ambulatory Visit: Payer: Self-pay

## 2024-11-25 ENCOUNTER — Encounter: Payer: Self-pay | Admitting: Pharmacist

## 2024-11-30 ENCOUNTER — Other Ambulatory Visit (HOSPITAL_COMMUNITY): Payer: Self-pay

## 2024-11-30 MED ORDER — KETOCONAZOLE 2 % EX SHAM
MEDICATED_SHAMPOO | CUTANEOUS | 2 refills | Status: AC
  Filled 2024-11-30: qty 120, 30d supply, fill #0
  Filled 2025-02-03: qty 120, 30d supply, fill #1

## 2024-12-02 ENCOUNTER — Other Ambulatory Visit (HOSPITAL_COMMUNITY): Payer: Self-pay

## 2024-12-03 ENCOUNTER — Other Ambulatory Visit (HOSPITAL_COMMUNITY): Payer: Self-pay

## 2024-12-09 DIAGNOSIS — M79641 Pain in right hand: Secondary | ICD-10-CM | POA: Diagnosis not present

## 2024-12-10 ENCOUNTER — Other Ambulatory Visit (HOSPITAL_COMMUNITY): Payer: Self-pay

## 2024-12-10 ENCOUNTER — Encounter: Payer: Self-pay | Admitting: Nurse Practitioner

## 2024-12-10 ENCOUNTER — Telehealth: Payer: Self-pay | Admitting: Nurse Practitioner

## 2024-12-10 ENCOUNTER — Other Ambulatory Visit: Payer: Self-pay

## 2024-12-10 DIAGNOSIS — F902 Attention-deficit hyperactivity disorder, combined type: Secondary | ICD-10-CM | POA: Diagnosis not present

## 2024-12-10 DIAGNOSIS — L658 Other specified nonscarring hair loss: Secondary | ICD-10-CM | POA: Diagnosis not present

## 2024-12-10 DIAGNOSIS — T50905A Adverse effect of unspecified drugs, medicaments and biological substances, initial encounter: Secondary | ICD-10-CM

## 2024-12-10 MED ORDER — AMPHETAMINE-DEXTROAMPHETAMINE 20 MG PO TABS
20.0000 mg | ORAL_TABLET | Freq: Two times a day (BID) | ORAL | 0 refills | Status: AC
  Filled 2025-02-03: qty 60, 30d supply, fill #0

## 2024-12-10 MED ORDER — AMPHETAMINE-DEXTROAMPHETAMINE 20 MG PO TABS
20.0000 mg | ORAL_TABLET | Freq: Two times a day (BID) | ORAL | 0 refills | Status: AC
  Filled 2024-12-10: qty 60, 30d supply, fill #0

## 2024-12-10 MED ORDER — AMPHETAMINE-DEXTROAMPHETAMINE 20 MG PO TABS: 20.0000 mg | ORAL_TABLET | Freq: Two times a day (BID) | ORAL | 0 refills | Status: AC

## 2024-12-10 MED ORDER — MINOXIDIL 2.5 MG PO TABS
2.5000 mg | ORAL_TABLET | Freq: Every day | ORAL | 0 refills | Status: AC
  Filled 2024-12-10: qty 90, 90d supply, fill #0

## 2024-12-10 NOTE — Assessment & Plan Note (Signed)
 ADHD is well-managed with current medication regimen. No issues with sleep, anxiety, palpitations, or jitteriness. Blood pressure is normal. Weight is stable at 142 lbs. - Continue current ADHD medication regimen. - Allow for additional dose in the afternoon if needed. - Sent prescription for next three months to [facility] pharmacy.  Orders:   amphetamine -dextroamphetamine  (ADDERALL) 20 MG tablet; Take 1 tablet (20 mg total) by mouth 2 (two) times daily.   amphetamine -dextroamphetamine  (ADDERALL) 20 MG tablet; Take 1 tablet (20 mg total) by mouth 2 (two) times daily.   amphetamine -dextroamphetamine  (ADDERALL) 20 MG tablet; Take 1 tablet (20 mg total) by mouth 2 (two) times daily.

## 2024-12-10 NOTE — Progress Notes (Signed)
 Virtual Visit Encounter mychart visit.   I connected with  [name] on 12/10/2024 at  9:15 AM EST by secure video and audio telemedicine application. I verified that I am speaking with the correct person using two identifiers.   I introduced myself as a Publishing Rights Manager with the practice. The limitations of evaluation and management by telemedicine discussed with the patient and the availability of in person appointments. The patient expressed verbal understanding and consent to proceed.  Participating parties in this visit include: Myself and patient  The patient is: Patient Location: Home I am: Provider Location: Office/Clinic Subjective:    CC and HPI:  Follow-up (F/u on adhd, )  History of Present Illness [name] is a 58 year old female who presents for a virtual follow-up regarding ADHD management.  She feels 'really good' with her current ADHD management, noting that 'everything kind of makes more sense now.' She takes her ADHD medication once daily without a drop in effectiveness in the afternoon and has not needed an additional half dose. No issues with sleep interruption, increased anxiety, palpitations, or jitteriness. Her blood pressure was normal at a recent urgent care visit, and her weight is stable at 142 pounds.  She recently experienced a kidney stone, initially thought to be a urinary tract infection. She describes the pain as severe and notes that it occurred after a trip to [place], and mentioned being careful about the water and possibly not drinking enough.  She is dealing with hair loss, which she attributes to abrupt weight loss. She uses a 5% foam treatment inconsistently and finds it inconvenient to apply. She is concerned about the potential toxicity of the foam to her cat.  She cut her finger in the kitchen and underwent surgery. 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 She hopes to return to work in January.  She received her flu shot on October 24th.  Past medical history, Surgical history, Family history not pertinant except as noted below, Social history, Allergies, and medications have been entered into the medical record, reviewed, and corrections made.   Review of Systems:  All review of systems negative except what is listed in the HPI  Objective:    Alert and oriented x 4 Speaking in clear sentences with no shortness of breath. No distress.  Impression and Recommendations:    Assessment & Plan Attention deficit hyperactivity disorder (ADHD), combined type ADHD is well-managed with current medication regimen. No issues with sleep, anxiety, palpitations, or jitteriness. Blood pressure is normal. Weight is stable at 142 lbs. - Continue current ADHD medication regimen. - Allow for additional dose in the afternoon if needed. - Sent prescription for next three months to [facility] pharmacy.  Orders:   amphetamine -dextroamphetamine  (ADDERALL) 20 MG tablet; Take 1 tablet (20 mg total) by mouth 2 (two) times daily.   amphetamine -dextroamphetamine  (ADDERALL) 20 MG tablet; Take 1 tablet (20 mg total) by mouth 2 (two) times daily.   amphetamine -dextroamphetamine  (ADDERALL) 20 MG tablet; Take 1 tablet (20 mg total) by mouth 2 (two) times daily.  Drug-related hair loss Hair loss likely due to abrupt weight loss. Currently using minoxidil foam treatment but considering switching to oral minoxidil for ease of use. No known interactions with current medications, including metoprolol . - Prescribed minoxidil 2.5 mg, with option to increase to 5 mg if no improvement after a few weeks. Orders:   minoxidil (LONITEN) 2.5 MG tablet;  Take 1 tablet (2.5 mg total) by mouth daily.    orders and follow up as documented in EMR I discussed the assessment and treatment plan with the patient. The patient was provided an opportunity to ask questions  and all were answered. The patient agreed with the plan and demonstrated an understanding of the instructions.   The patient was advised to call back or seek an in-person evaluation if the symptoms worsen or if the condition fails to improve as anticipated.  Follow-Up: CPE after June 10  I provided 26 minutes of non-face-to-face interaction with this non face-to-face encounter including intake, same-day documentation, and chart review.   Camie CHARLENA Doing, NP , DNP, AGNP-c [facility]

## 2024-12-11 ENCOUNTER — Other Ambulatory Visit (HOSPITAL_COMMUNITY): Payer: Self-pay

## 2024-12-23 DIAGNOSIS — M79641 Pain in right hand: Secondary | ICD-10-CM | POA: Diagnosis not present

## 2024-12-28 ENCOUNTER — Other Ambulatory Visit (HOSPITAL_COMMUNITY): Payer: Self-pay

## 2024-12-28 DIAGNOSIS — M25511 Pain in right shoulder: Secondary | ICD-10-CM | POA: Diagnosis not present

## 2024-12-28 MED ORDER — MELOXICAM 15 MG PO TABS
15.0000 mg | ORAL_TABLET | Freq: Every day | ORAL | 0 refills | Status: AC
  Filled 2024-12-28: qty 14, 14d supply, fill #0

## 2025-01-07 ENCOUNTER — Other Ambulatory Visit (HOSPITAL_COMMUNITY): Payer: Self-pay

## 2025-01-07 MED ORDER — CYCLOBENZAPRINE HCL 5 MG PO TABS
5.0000 mg | ORAL_TABLET | Freq: Every evening | ORAL | 0 refills | Status: AC
  Filled 2025-01-07: qty 14, 14d supply, fill #0

## 2025-01-11 ENCOUNTER — Other Ambulatory Visit: Payer: Self-pay | Admitting: Nurse Practitioner

## 2025-01-11 DIAGNOSIS — E559 Vitamin D deficiency, unspecified: Secondary | ICD-10-CM

## 2025-01-12 ENCOUNTER — Other Ambulatory Visit (HOSPITAL_COMMUNITY): Payer: Self-pay

## 2025-01-12 MED ORDER — DICLOFENAC SODIUM 75 MG PO TBEC
75.0000 mg | DELAYED_RELEASE_TABLET | Freq: Two times a day (BID) | ORAL | 0 refills | Status: AC
  Filled 2025-01-12: qty 60, 30d supply, fill #0

## 2025-01-13 ENCOUNTER — Other Ambulatory Visit (HOSPITAL_COMMUNITY): Payer: Self-pay

## 2025-01-14 ENCOUNTER — Other Ambulatory Visit: Payer: Self-pay | Admitting: Nurse Practitioner

## 2025-01-14 DIAGNOSIS — I10 Essential (primary) hypertension: Secondary | ICD-10-CM

## 2025-01-15 ENCOUNTER — Other Ambulatory Visit: Payer: Self-pay

## 2025-01-15 ENCOUNTER — Other Ambulatory Visit (HOSPITAL_COMMUNITY): Payer: Self-pay

## 2025-01-15 MED ORDER — METOPROLOL TARTRATE 25 MG PO TABS
25.0000 mg | ORAL_TABLET | Freq: Two times a day (BID) | ORAL | 1 refills | Status: AC
  Filled 2025-01-15: qty 180, 90d supply, fill #0

## 2025-02-03 ENCOUNTER — Other Ambulatory Visit: Payer: Self-pay | Admitting: Nurse Practitioner

## 2025-02-03 ENCOUNTER — Other Ambulatory Visit: Payer: Self-pay

## 2025-02-03 DIAGNOSIS — Z1231 Encounter for screening mammogram for malignant neoplasm of breast: Secondary | ICD-10-CM

## 2025-02-04 ENCOUNTER — Other Ambulatory Visit: Payer: Self-pay | Admitting: Medical Genetics

## 2025-03-02 ENCOUNTER — Ambulatory Visit

## 2025-03-02 DIAGNOSIS — Z1231 Encounter for screening mammogram for malignant neoplasm of breast: Secondary | ICD-10-CM
